# Patient Record
Sex: Female | Born: 1950 | Race: White | Hispanic: No | Marital: Married
Health system: Southern US, Community
[De-identification: ages and names within clinical notes are randomized; demographics above are authoritative.]

## PROBLEM LIST (undated history)

## (undated) DIAGNOSIS — I471 Supraventricular tachycardia, unspecified: Secondary | ICD-10-CM

## (undated) DIAGNOSIS — G4733 Obstructive sleep apnea (adult) (pediatric): Secondary | ICD-10-CM

## (undated) DIAGNOSIS — G4730 Sleep apnea, unspecified: Secondary | ICD-10-CM

## (undated) DIAGNOSIS — I2510 Atherosclerotic heart disease of native coronary artery without angina pectoris: Secondary | ICD-10-CM

## (undated) DIAGNOSIS — E7800 Pure hypercholesterolemia, unspecified: Secondary | ICD-10-CM

## (undated) DIAGNOSIS — K219 Gastro-esophageal reflux disease without esophagitis: Secondary | ICD-10-CM

## (undated) DIAGNOSIS — J4489 Other specified chronic obstructive pulmonary disease: Secondary | ICD-10-CM

## (undated) DIAGNOSIS — I252 Old myocardial infarction: Secondary | ICD-10-CM

## (undated) DIAGNOSIS — J449 Chronic obstructive pulmonary disease, unspecified: Secondary | ICD-10-CM

## (undated) DIAGNOSIS — G43909 Migraine, unspecified, not intractable, without status migrainosus: Secondary | ICD-10-CM

## (undated) DIAGNOSIS — E079 Disorder of thyroid, unspecified: Secondary | ICD-10-CM

## (undated) DIAGNOSIS — M549 Dorsalgia, unspecified: Secondary | ICD-10-CM

## (undated) DIAGNOSIS — E559 Vitamin D deficiency, unspecified: Secondary | ICD-10-CM

## (undated) DIAGNOSIS — F329 Major depressive disorder, single episode, unspecified: Secondary | ICD-10-CM

## (undated) DIAGNOSIS — K5900 Constipation, unspecified: Secondary | ICD-10-CM

## (undated) DIAGNOSIS — Z972 Presence of dental prosthetic device (complete) (partial): Secondary | ICD-10-CM

## (undated) DIAGNOSIS — R002 Palpitations: Secondary | ICD-10-CM

## (undated) DIAGNOSIS — R7303 Prediabetes: Secondary | ICD-10-CM

## (undated) DIAGNOSIS — F32A Depression, unspecified: Secondary | ICD-10-CM

## (undated) DIAGNOSIS — M2550 Pain in unspecified joint: Secondary | ICD-10-CM

## (undated) HISTORY — PX: OTHER SURGICAL HISTORY: SHX169

## (undated) HISTORY — DX: Chronic obstructive pulmonary disease, unspecified: J44.9

## (undated) HISTORY — DX: Sleep apnea, unspecified: G47.30

## (undated) HISTORY — PX: CHOLECYSTECTOMY: SHX55

## (undated) HISTORY — DX: Dorsalgia, unspecified: M54.9

## (undated) HISTORY — DX: Constipation, unspecified: K59.00

## (undated) HISTORY — PX: ABDOMINAL HYSTERECTOMY: SHX81

## (undated) HISTORY — DX: Vitamin D deficiency, unspecified: E55.9

## (undated) HISTORY — DX: Pain in unspecified joint: M25.50

## (undated) HISTORY — DX: Prediabetes: R73.03

## (undated) HISTORY — DX: Palpitations: R00.2

## (undated) HISTORY — DX: Old myocardial infarction: I25.2

---

## 2014-06-14 DIAGNOSIS — I471 Supraventricular tachycardia, unspecified: Secondary | ICD-10-CM | POA: Insufficient documentation

## 2014-07-30 DIAGNOSIS — I4719 Other supraventricular tachycardia: Secondary | ICD-10-CM | POA: Insufficient documentation

## 2014-07-30 HISTORY — PX: OTHER SURGICAL HISTORY: SHX169

## 2018-07-28 DIAGNOSIS — E039 Hypothyroidism, unspecified: Secondary | ICD-10-CM | POA: Insufficient documentation

## 2018-07-28 DIAGNOSIS — K219 Gastro-esophageal reflux disease without esophagitis: Secondary | ICD-10-CM | POA: Insufficient documentation

## 2018-07-28 DIAGNOSIS — F3341 Major depressive disorder, recurrent, in partial remission: Secondary | ICD-10-CM | POA: Insufficient documentation

## 2018-08-04 DIAGNOSIS — E782 Mixed hyperlipidemia: Secondary | ICD-10-CM | POA: Insufficient documentation

## 2018-08-04 DIAGNOSIS — R001 Bradycardia, unspecified: Secondary | ICD-10-CM | POA: Insufficient documentation

## 2018-08-06 ENCOUNTER — Other Ambulatory Visit: Payer: Self-pay | Admitting: Medical Oncology

## 2018-08-06 DIAGNOSIS — Z13820 Encounter for screening for osteoporosis: Secondary | ICD-10-CM

## 2018-08-06 DIAGNOSIS — Z780 Asymptomatic menopausal state: Secondary | ICD-10-CM

## 2018-09-02 DIAGNOSIS — I2510 Atherosclerotic heart disease of native coronary artery without angina pectoris: Secondary | ICD-10-CM | POA: Insufficient documentation

## 2019-01-24 ENCOUNTER — Ambulatory Visit
Admission: EM | Admit: 2019-01-24 | Discharge: 2019-01-24 | Disposition: A | Discharge status: Home / Self Care | Payer: Medicare HMO

## 2019-01-24 ENCOUNTER — Other Ambulatory Visit: Payer: Self-pay

## 2019-01-24 DIAGNOSIS — M778 Other enthesopathies, not elsewhere classified: Secondary | ICD-10-CM

## 2019-01-24 DIAGNOSIS — X503XXA Overexertion from repetitive movements, initial encounter: Secondary | ICD-10-CM | POA: Diagnosis not present

## 2019-01-24 HISTORY — DX: Pure hypercholesterolemia, unspecified: E78.00

## 2019-01-24 HISTORY — DX: Gastro-esophageal reflux disease without esophagitis: K21.9

## 2019-01-24 HISTORY — DX: Supraventricular tachycardia, unspecified: I47.10

## 2019-01-24 HISTORY — DX: Depression, unspecified: F32.A

## 2019-01-24 HISTORY — DX: Major depressive disorder, single episode, unspecified: F32.9

## 2019-01-24 HISTORY — DX: Supraventricular tachycardia: I47.1

## 2019-01-24 HISTORY — DX: Disorder of thyroid, unspecified: E07.9

## 2019-01-24 MED ORDER — MELOXICAM 7.5 MG PO TABS: 7.5000 mg | ORAL_TABLET | Freq: Every day | ORAL | 0 refills | Status: DC

## 2019-01-24 NOTE — ED Triage Notes (Signed)
Pt with left wrist pain x past 9 days. Thinks it is from overuse but unsure. No exact injury.

## 2019-01-24 NOTE — ED Provider Notes (Signed)
[facility]    CSN: [id number] Arrival date & time: 01/24/19  1052     History   Chief Complaint Chief Complaint  Patient presents with  . Wrist Pain    HPI Nicole Robles is a 68 y.o. female.   Nicole Robles presents with complaints of left wrist pain which started over a week ago. Worse with activity, improves at rest. 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 She states it is quite repetitive. She has done this for 21 years. No redness, swelling or warmth. No specific injury. Had to leave work last night due to pain which reached 10/10 in severity. Took advil yesterday and applied icy-hot which minimally helped. Hasn't taken any medications today. 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 Doesn't follow with orthopedics. No numbness tingling. Pain radiates to left thumb. She is right hand dominate.     ROS per HPI, negative if not otherwise mentioned.      Past Medical History:  Diagnosis Date  . Depression   . GERD (gastroesophageal reflux disease)   . High cholesterol   . SVT (supraventricular tachycardia) (HCC)   . Thyroid disease     There are no active problems to display for this patient.   Past Surgical History:  Procedure Laterality Date  . ABDOMINAL HYSTERECTOMY    . CHOLECYSTECTOMY    . heart ablation      OB History   No obstetric history on file.      Home Medications    Prior to Admission medications   Medication Sig Start Date End Date Taking? Authorizing Provider  aspirin EC 81 MG tablet Take 81 mg by mouth daily.   Yes [provider]  levothyroxine (SYNTHROID) 88 MCG tablet Take 88 mcg by mouth daily before breakfast.   Yes [provider]  metoprolol tartrate (LOPRESSOR) 50 MG tablet Take 50 mg by mouth 2 (two) times daily.   Yes [provider]  omeprazole (PRILOSEC)  20 MG capsule Take 20 mg by mouth daily.   Yes [provider]  pravastatin (PRAVACHOL) 10 MG tablet Take 10 mg by mouth daily.   Yes [provider]  sertraline (ZOLOFT) 25 MG tablet Take 25 mg by mouth daily.   Yes [provider]  meloxicam (MOBIC) 7.5 MG tablet Take 1 tablet (7.5 mg total) by mouth daily. 01/24/19   Georgetta Haber, NP    Family History History reviewed. No pertinent family history.  Social History Social History   Tobacco Use  . Smoking status: Never Smoker  . Smokeless tobacco: Never Used  Substance Use Topics  . Alcohol use: Not Currently  . Drug use: Not Currently     Allergies   Augmentin [amoxicillin-pot clavulanate] and Darvon [propoxyphene]   Review of Systems Review of Systems   Physical Exam Triage Vital Signs ED Triage Vitals  Enc Vitals Group     BP 01/24/19 1111 (!) 144/90     Pulse Rate 01/24/19 1111 (!) 56     Resp 01/24/19 1111 17     Temp 01/24/19 1111 98.1 F (36.7 C)     Temp Source 01/24/19 1111 Oral     SpO2 01/24/19 1111 98 %     Weight 01/24/19 1105 185 lb (83.9 kg)     Height 01/24/19 1105 4\' 11"  (1.499  m)     Head Circumference --      Peak Flow --      Pain Score 01/24/19 1105 10     Pain Loc --      Pain Edu? --      Excl. in GC? --    No data found.  Updated Vital Signs BP (!) 144/90 (BP Location: Left Arm)   Pulse (!) 56   Temp 98.1 F (36.7 C) (Oral)   Resp 17   Ht 4\' 11"  (1.499 m)   Wt 185 lb (83.9 kg)   SpO2 98%   BMI 37.37 kg/m   Visual Acuity Right Eye Distance:   Left Eye Distance:   Bilateral Distance:    Right Eye Near:   Left Eye Near:    Bilateral Near:     Physical Exam Constitutional:      General: She is not in acute distress.    Appearance: She is well-developed.  Cardiovascular:     Rate and Rhythm: Normal rate and regular rhythm.     Heart sounds: Normal heart sounds.  Pulmonary:     Effort: Pulmonary effort is normal.     Breath sounds: Normal  breath sounds.  Musculoskeletal:     Left wrist: She exhibits tenderness. She exhibits normal range of motion, no bony tenderness, no swelling, no effusion, no crepitus, no deformity and no laceration.     Comments: Strong radial pulse; no redness or swelling; cap refill < 2 seconds to fingers; gross sensation intact; positive finkelstein; tenderness to radial aspect of left wrist without specific bony point tenderness   Skin:    General: Skin is warm and dry.  Neurological:     Mental Status: She is alert and oriented to person, place, and time.      UC Treatments / Results  Labs (all labs ordered are listed, but only abnormal results are displayed) Labs Reviewed - No data to display  EKG None  Radiology No results found.  Procedures Procedures (including critical care time)  Medications Ordered in UC Medications - No data to display  Initial Impression / Assessment and Plan / UC Course  I have reviewed the triage vital signs and the nursing notes.  Pertinent labs & imaging results that were available during my care of the patient were reviewed by me and considered in my medical decision making (see chart for details).     H&p consistent with de quervain tenosynovitis. Thumb spica provided and encouraged nsaid, ice, elevation. Follow up with pcp and/or ortho as needed for persistent symptoms. Patient verbalized understanding and agreeable to plan.   Final Clinical Impressions(s) / UC Diagnoses   Final diagnoses:  Left wrist tendonitis     Discharge Instructions     Ice and elevation of wrist, especially after working.  Daily meloxicam, take with food.  Use of brace as provided, especially while working.  Follow up with your primary care provider and/or orthopedics for any persistent or worsening of symptoms as may need further intervention.    ED Prescriptions    Medication Sig Dispense Auth. Provider   meloxicam (MOBIC) 7.5 MG tablet Take 1 tablet (7.5 mg  total) by mouth daily. 20 tablet Georgetta HaberBurky,  B, NP     Controlled Substance Prescriptions [place] Controlled Substance Registry consulted? Not Applicable   Georgetta HaberBurky,  B, NP 01/24/19 1136

## 2019-01-24 NOTE — Discharge Instructions (Signed)
Ice and elevation of wrist, especially after working.  Daily meloxicam, take with food.  Use of brace as provided, especially while working.  Follow up with your primary care provider and/or orthopedics for any persistent or worsening of symptoms as may need further intervention.

## 2019-01-26 DIAGNOSIS — G43009 Migraine without aura, not intractable, without status migrainosus: Secondary | ICD-10-CM | POA: Insufficient documentation

## 2020-03-03 ENCOUNTER — Encounter: Payer: Self-pay | Admitting: Emergency Medicine

## 2020-03-03 ENCOUNTER — Emergency Department
Admission: EM | Admit: 2020-03-03 | Discharge: 2020-03-03 | Disposition: A | Discharge status: Home / Self Care | Payer: Medicare HMO | Attending: Emergency Medicine | Admitting: Emergency Medicine

## 2020-03-03 ENCOUNTER — Other Ambulatory Visit: Payer: Self-pay

## 2020-03-03 ENCOUNTER — Emergency Department: Payer: Medicare HMO

## 2020-03-03 DIAGNOSIS — J40 Bronchitis, not specified as acute or chronic: Secondary | ICD-10-CM | POA: Insufficient documentation

## 2020-03-03 DIAGNOSIS — Z7982 Long term (current) use of aspirin: Secondary | ICD-10-CM | POA: Insufficient documentation

## 2020-03-03 DIAGNOSIS — R0602 Shortness of breath: Secondary | ICD-10-CM | POA: Insufficient documentation

## 2020-03-03 DIAGNOSIS — Z79899 Other long term (current) drug therapy: Secondary | ICD-10-CM | POA: Insufficient documentation

## 2020-03-03 DIAGNOSIS — Z20822 Contact with and (suspected) exposure to covid-19: Secondary | ICD-10-CM | POA: Insufficient documentation

## 2020-03-03 DIAGNOSIS — R5383 Other fatigue: Secondary | ICD-10-CM | POA: Diagnosis present

## 2020-03-03 LAB — COMPREHENSIVE METABOLIC PANEL
ALT: 18 U/L (ref 0–44)
AST: 23 U/L (ref 15–41)
Albumin: 3.6 g/dL (ref 3.5–5.0)
Alkaline Phosphatase: 79 U/L (ref 38–126)
Anion gap: 7 (ref 5–15)
BUN: 9 mg/dL (ref 8–23)
CO2: 29 mmol/L (ref 22–32)
Calcium: 8.7 mg/dL — ABNORMAL LOW (ref 8.9–10.3)
Chloride: 103 mmol/L (ref 98–111)
Creatinine, Ser: 0.78 mg/dL (ref 0.44–1.00)
GFR calc Af Amer: >60 mL/min (ref >60)
GFR calc non Af Amer: >60 mL/min (ref >60)
Glucose, Bld: 107 mg/dL — ABNORMAL HIGH (ref 70–99)
Potassium: 4.1 mmol/L (ref 3.5–5.1)
Sodium: 139 mmol/L (ref 135–145)
Total Bilirubin: 0.6 mg/dL (ref 0.3–1.2)
Total Protein: 7.2 g/dL (ref 6.5–8.1)

## 2020-03-03 LAB — CBC WITH DIFFERENTIAL/PLATELET
Abs Immature Granulocytes: 0.02 10*3/uL (ref 0.00–0.07)
Basophils Absolute: 0.1 10*3/uL (ref 0.0–0.1)
Basophils Relative: 1 %
Eosinophils Absolute: 0.5 10*3/uL (ref 0.0–0.5)
Eosinophils Relative: 6 %
HCT: 39.3 % (ref 36.0–46.0)
Hemoglobin: 12.6 g/dL (ref 12.0–15.0)
Immature Granulocytes: 0 %
Lymphocytes Relative: 36 %
Lymphs Abs: 3 10*3/uL (ref 0.7–4.0)
MCH: 28.3 pg (ref 26.0–34.0)
MCHC: 32.1 g/dL (ref 30.0–36.0)
MCV: 88.3 fL (ref 80.0–100.0)
Monocytes Absolute: 0.5 10*3/uL (ref 0.1–1.0)
Monocytes Relative: 6 %
Neutro Abs: 4.2 10*3/uL (ref 1.7–7.7)
Neutrophils Relative %: 51 %
Platelets: 226 10*3/uL (ref 150–400)
RBC: 4.45 MIL/uL (ref 3.87–5.11)
RDW: 13.2 % (ref 11.5–15.5)
WBC: 8.3 10*3/uL (ref 4.0–10.5)
nRBC: 0 % (ref 0.0–0.2)

## 2020-03-03 LAB — BRAIN NATRIURETIC PEPTIDE: B Natriuretic Peptide: 73.5 pg/mL (ref 0.0–100.0)

## 2020-03-03 LAB — TROPONIN I (HIGH SENSITIVITY): Troponin I (High Sensitivity): 3 ng/L (ref <18)

## 2020-03-03 LAB — SARS CORONAVIRUS 2 BY RT PCR (HOSPITAL ORDER, PERFORMED IN ~~LOC~~ HOSPITAL LAB): SARS Coronavirus 2: NEGATIVE

## 2020-03-03 LAB — MAGNESIUM: Magnesium: 2.1 mg/dL (ref 1.7–2.4)

## 2020-03-03 LAB — TSH: TSH: 1.37 u[IU]/mL (ref 0.350–4.500)

## 2020-03-03 LAB — T4, FREE: Free T4: 0.78 ng/dL (ref 0.61–1.12)

## 2020-03-03 MED ORDER — BENZONATATE 100 MG PO CAPS: 100.0000 mg | ORAL_CAPSULE | Freq: Three times a day (TID) | ORAL | 0 refills | Status: DC | PRN

## 2020-03-03 MED ORDER — DEXAMETHASONE SODIUM PHOSPHATE 10 MG/ML IJ SOLN
10.0000 mg | Freq: Once | INTRAMUSCULAR | Status: AC
  Administered 2020-03-03: 10 mg via INTRAVENOUS
  Filled 2020-03-03: qty 1

## 2020-03-03 MED ORDER — ALBUTEROL SULFATE HFA 108 (90 BASE) MCG/ACT IN AERS: INHALATION_SPRAY | RESPIRATORY_TRACT | 1 refills | Status: DC

## 2020-03-03 MED ORDER — HYDROCODONE-HOMATROPINE 5-1.5 MG/5ML PO SYRP: 5.0000 mL | ORAL_SOLUTION | Freq: Four times a day (QID) | ORAL | 0 refills | Status: DC | PRN

## 2020-03-03 NOTE — Discharge Instructions (Addendum)
As we discussed, your work-up tonight was reassuring.  Your chest x-ray did not show a specific abnormality and given that you have no history of congestive heart failure it is unlikely that you have developed too much fluid in your lungs.  I believe you are suffering from bronchitis and the medication you were given (Decadron 10 mg IV) as well as albuterol inhaler and cough syrup you have prescribed should help.  Please follow-up with your regular doctor at the next available opportunity.  If your symptoms get worse or if you develop new symptoms that concern you, please return to the emergency department.

## 2020-03-03 NOTE — ED Triage Notes (Signed)
Pt to ED via EMS from home c/o weakness x1 week.  Pt A&Ox4, chest rise even and unlabored, states was walking to bathroom tonight and felt too weak to continue.

## 2020-03-03 NOTE — ED Provider Notes (Addendum)
[facility] Emergency Department Provider Note  ____________________________________________   First MD Initiated Contact with Patient 03/03/20 [phone]     (approximate)  I have reviewed the triage vital signs and the nursing notes.   HISTORY  Chief Complaint Fatigue    HPI Nicole Robles is a 69 y.o. female with medical history as listed below who presents by EMS for evaluation of fatigue and generalized weakness.  She reports that she has had a cough for about a week and felt very rundown.  She has also had subjective fevers, nasal congestion/runny nose, and some sinus pressure.  She has had some shortness of breath mostly associated with exertion.  She reports that her symptoms are severe.  She went to [facility] and had a negative rapid antigen Covid test and then she went to the urgent care  2 to 3 days ago and had a PCR Covid test that came back negative.  It was explained to her that she likely had a viral infection and that antibiotics did not serve a role at this time.  However she says that she is continues to not feel well.  She got up during the night and felt lightheaded so she called 911.  She reports that she has been eating and drinking normally.  She denies chest pain, nausea, vomiting, abdominal pain, and dysuria.  Nothing in particular makes her symptoms better and exertion seems to make him a little bit worse.  She said that her voice is hoarse because of all the coughing but she does not have a sore throat now.  She did previously have a sore throat.        Past Medical History:  Diagnosis Date  . Depression   . GERD (gastroesophageal reflux disease)   . High cholesterol   . SVT (supraventricular tachycardia) (HCC)   . Thyroid disease     There are no problems to display for this patient.   Past Surgical History:  Procedure Laterality Date  . ABDOMINAL HYSTERECTOMY    . CHOLECYSTECTOMY    . heart ablation      Prior to  Admission medications   Medication Sig Start Date End Date Taking? Authorizing Provider  albuterol (VENTOLIN HFA) 108 (90 Base) MCG/ACT inhaler Inhale 2-4 puffs by mouth every 4 hours as needed for wheezing, cough, and/or shortness of breath 03/03/20   Loleta Rose, MD  aspirin EC 81 MG tablet Take 81 mg by mouth daily.    [provider]  benzonatate (TESSALON PERLES) 100 MG capsule Take 1 capsule (100 mg total) by mouth 3 (three) times daily as needed for cough. 03/03/20   Loleta Rose, MD  HYDROcodone-homatropine [facility]) 5-1.5 MG/5ML syrup Take 5 mLs by mouth every 6 (six) hours as needed for cough. 03/03/20   Loleta Rose, MD  levothyroxine (SYNTHROID) 88 MCG tablet Take 88 mcg by mouth daily before breakfast.    [provider]  meloxicam (MOBIC) 7.5 MG tablet Take 1 tablet (7.5 mg total) by mouth daily. 01/24/19   Georgetta Haber, NP  metoprolol tartrate (LOPRESSOR) 50 MG tablet Take 50 mg by mouth 2 (two) times daily.    [provider]  omeprazole (PRILOSEC) 20 MG capsule Take 20 mg by mouth daily.    [provider]  pravastatin (PRAVACHOL) 10 MG tablet Take 10 mg by mouth daily.    [provider]  sertraline (ZOLOFT) 25 MG tablet Take 25 mg by mouth daily.    [provider]    Allergies Augmentin [amoxicillin-pot clavulanate] and Darvon [propoxyphene]  History reviewed. No pertinent family history.  Social History Social History   Tobacco Use  . Smoking status: Never Smoker  . Smokeless tobacco: Never Used  Substance Use Topics  . Alcohol use: Not Currently  . Drug use: Not Currently    Review of Systems Constitutional: Subjective fever Eyes: No visual changes. ENT: Hoarse voice.  Sore throat previously, but no longer.  Nasal congestion and sinus pressure. Cardiovascular: Denies chest pain. Respiratory: Persistent cough.  Mild SOB with exertion. Gastrointestinal: No abdominal pain.  No nausea, no vomiting.  No  diarrhea.  No constipation.  Continued appropriate oral intake. Genitourinary: Negative for dysuria. Musculoskeletal: Negative for neck pain.  Negative for back pain. Integumentary: Negative for rash. Neurological: Negative for headaches, focal weakness or numbness.   ____________________________________________   PHYSICAL EXAM:  VITAL SIGNS: ED Triage Vitals  Enc Vitals Group     BP 03/03/20 0401 (!) 145/95     Pulse Rate 03/03/20 0401 60     Resp 03/03/20 0401 18     Temp 03/03/20 0401 97.9 F (36.6 C)     Temp Source 03/03/20 0401 Oral     SpO2 03/03/20 0401 97 %     Weight 03/03/20 0402 90.7 kg (200 lb)     Height 03/03/20 0402 1.511 m (4' 11.5")     Head Circumference --      Peak Flow --      Pain Score 03/03/20 0401 0     Pain Loc --      Pain Edu? --      Excl. in GC? --     Constitutional: Alert and oriented.  No acute distress. Eyes: Conjunctivae are normal.  Head: Atraumatic. Nose: No congestion/rhinnorhea. Mouth/Throat: Patient is wearing a mask. Neck: No stridor.  No meningeal signs.   Cardiovascular: Normal rate, regular rhythm. Good peripheral circulation. Grossly normal heart sounds. Respiratory: Normal respiratory effort.  No retractions.  Breath sounds are clear throughout with no wheezes, rales, nor rhonchi.  However she has episodes of bronchospasms with prolonged coughing episodes while I am in the room. Gastrointestinal: Soft and nontender. No distention.  Musculoskeletal: No lower extremity tenderness nor edema. No gross deformities of extremities. Neurologic:  Normal speech and language. No gross focal neurologic deficits are appreciated.  Skin:  Skin is warm, dry and intact. Psychiatric: Mood and affect are normal. Speech and behavior are normal.  ____________________________________________   LABS (all labs ordered are listed, but only abnormal results are displayed)  Labs Reviewed  COMPREHENSIVE METABOLIC PANEL - Abnormal; Notable for the  following components:      Result Value   Glucose, Bld 107 (*)    Calcium 8.7 (*)    All other components within normal limits  SARS CORONAVIRUS 2 BY RT PCR (HOSPITAL ORDER, PERFORMED IN [facility])  BRAIN NATRIURETIC PEPTIDE  MAGNESIUM  CBC WITH DIFFERENTIAL/PLATELET  TSH  T4, FREE  TROPONIN I (HIGH SENSITIVITY)   ____________________________________________  EKG  None - EKG not ordered by ED physician ____________________________________________  RADIOLOGY Marylou Mccoy, personally viewed and evaluated these images (plain radiographs) as part of my medical decision making, as well as reviewing the written report by the radiologist.  ED MD interpretation: Bronchitis versus pulmonary vascular congestion, no pulmonary edema  Official radiology report(s): DG Chest Portable 1 View  Result Date: 03/03/2020 CLINICAL DATA:  Shortness of breath.  Cough for 1 week. EXAM: PORTABLE  CHEST 1 VIEW COMPARISON:  None. FINDINGS: The heart is mildly enlarged, exaggerated by low lung volumes. Atherosclerotic changes are noted at the aortic arch. Mild bibasilar airspace opacities are present. No significant consolidation is present. Mild pulmonary vascular congestion is noted without frank edema. No effusions are present. IMPRESSION: 1. Mild cardiomegaly and pulmonary vascular congestion without frank edema. 2. Mild bibasilar airspace disease likely reflects atelectasis. Infection is considered less likely. 3. Aortic atherosclerosis. Electronically Signed   By: San Morelle M.D.   On: 03/03/2020 04:35    ____________________________________________   PROCEDURES   Procedure(s) performed (including Critical Care):  Procedures   ____________________________________________   INITIAL IMPRESSION / MDM / [place] / ED COURSE  As part of my medical decision making, I reviewed the following data within the [place] notes reviewed and  incorporated, Labs reviewed , Old chart reviewed, Radiograph reviewed  and Notes from prior ED visits and reviewed [place] controlled substance database.   Differential diagnosis includes, but is not limited to, COVID-19, nonspecific viral illness, COPD exacerbation (the patient says she is told that she has COPD but she has never been formally diagnosed), asthma, pneumonia.  The patient's vital signs are stable.  She is afebrile.  COVID-19 test is pending although she says she has already had a PCR test.   Clinical Course as of Mar 04 611  Fri Mar 03, 2020  0520 SARS Coronavirus 2: NEGATIVE [CF]  0605 Work-up has been reassuring.  The patient has been stable for more than 2 hours in the emergency department.  I added on TSH and free T4 and the patient knows she can check these in MyChart, but there is no indication to keep her longer because I will not be making any dose changes to her levothyroxine.  She has a primary care provider and she knows to follow-up with the doctor.  Her CBC, CMP, and troponin are all within normal limits.  Vital signs have been stable and she is normotensive.  Chest x-ray shows pulmonary vascular congestion but no frank pulmonary edema.  She has no history of CHF and based on her history with upper respiratory symptoms, frequent cough, nasal congestion, etc., I think that the chest x-ray is more representative of a bronchitic pattern than fluid overload.  She has no other signs or symptoms of fluid overload and no history of heart failure.  I encourage close follow-up with her primary care doctor.  I will treat her empirically with Decadron 10 mg IV, and write prescriptions for an albuterol inhaler and some cough medicine.   [CF]  0607 I discussed this with the patient and her husband and they understand and agree with the plan.   [CF]    Clinical Course User Index [CF] Hinda Kehr, MD     ____________________________________________  FINAL CLINICAL  IMPRESSION(S) / ED DIAGNOSES  Final diagnoses:  Bronchitis     MEDICATIONS GIVEN DURING THIS VISIT:  Medications  dexamethasone (DECADRON) injection 10 mg (has no administration in time range)     ED Discharge Orders         Ordered    HYDROcodone-homatropine (HYCODAN) 5-1.5 MG/5ML syrup  Every 6 hours PRN     03/03/20 0612    benzonatate (TESSALON PERLES) 100 MG capsule  3 times daily PRN     03/03/20 0612    albuterol (VENTOLIN HFA) 108 (90 Base) MCG/ACT inhaler     03/03/20 0612          *  Please note:  Nicole Robles was evaluated in Emergency Department on 03/03/2020 for the symptoms described in the history of present illness. She was evaluated in the context of the global COVID-19 pandemic, which necessitated consideration that the patient might be at risk for infection with the SARS-CoV-2 virus that causes COVID-19. Institutional protocols and algorithms that pertain to the evaluation of patients at risk for COVID-19 are in a state of rapid change based on information released by regulatory bodies including the CDC and federal and state organizations. These policies and algorithms were followed during the patient's care in the ED.  Some ED evaluations and interventions may be delayed as a result of limited staffing during the pandemic.*  Note:  This document was prepared using Dragon voice recognition software and may include unintentional dictation errors.   Loleta Rose, MD 03/03/20 5894    Loleta Rose, MD 03/03/20 [phone]

## 2020-04-18 ENCOUNTER — Telehealth: Payer: Self-pay

## 2020-04-18 NOTE — Telephone Encounter (Signed)
Patient called in asking for someone to call her back. Attempted x 1. No answer. No recent visit with [place], unsure for reason of call. [facility]

## 2020-10-11 ENCOUNTER — Other Ambulatory Visit: Payer: Self-pay

## 2020-10-11 DIAGNOSIS — Z1231 Encounter for screening mammogram for malignant neoplasm of breast: Secondary | ICD-10-CM

## 2020-10-31 ENCOUNTER — Ambulatory Visit: Payer: Medicare HMO

## 2020-12-05 ENCOUNTER — Ambulatory Visit
Admission: RE | Admit: 2020-12-05 | Discharge: 2020-12-05 | Disposition: A | Discharge status: Home / Self Care | Payer: Medicare (Managed Care) | Source: Ambulatory Visit

## 2020-12-05 ENCOUNTER — Other Ambulatory Visit: Payer: Self-pay

## 2020-12-05 DIAGNOSIS — Z1231 Encounter for screening mammogram for malignant neoplasm of breast: Secondary | ICD-10-CM | POA: Insufficient documentation

## 2020-12-06 ENCOUNTER — Inpatient Hospital Stay
Admission: RE | Admit: 2020-12-06 | Discharge: 2020-12-06 | Disposition: A | Discharge status: Home / Self Care | Payer: Self-pay | Source: Ambulatory Visit | Attending: *Deleted | Admitting: *Deleted

## 2020-12-06 ENCOUNTER — Other Ambulatory Visit: Payer: Self-pay | Admitting: *Deleted

## 2020-12-06 DIAGNOSIS — Z1231 Encounter for screening mammogram for malignant neoplasm of breast: Secondary | ICD-10-CM

## 2020-12-19 DIAGNOSIS — J4489 Other specified chronic obstructive pulmonary disease: Secondary | ICD-10-CM | POA: Insufficient documentation

## 2021-09-06 ENCOUNTER — Other Ambulatory Visit: Payer: Self-pay | Admitting: Family Medicine

## 2021-09-06 DIAGNOSIS — Z1231 Encounter for screening mammogram for malignant neoplasm of breast: Secondary | ICD-10-CM

## 2021-11-28 ENCOUNTER — Other Ambulatory Visit: Payer: Self-pay | Admitting: Family Medicine

## 2021-11-28 DIAGNOSIS — R1901 Right upper quadrant abdominal swelling, mass and lump: Secondary | ICD-10-CM

## 2021-11-28 DIAGNOSIS — R1011 Right upper quadrant pain: Secondary | ICD-10-CM

## 2021-12-10 ENCOUNTER — Other Ambulatory Visit: Payer: Self-pay

## 2021-12-10 ENCOUNTER — Ambulatory Visit
Admission: RE | Admit: 2021-12-10 | Discharge: 2021-12-10 | Disposition: A | Discharge status: Home / Self Care | Payer: Medicare PPO | Source: Ambulatory Visit | Attending: Family Medicine | Admitting: Family Medicine

## 2021-12-10 DIAGNOSIS — Z1231 Encounter for screening mammogram for malignant neoplasm of breast: Secondary | ICD-10-CM

## 2021-12-13 ENCOUNTER — Other Ambulatory Visit: Payer: Self-pay

## 2021-12-13 ENCOUNTER — Ambulatory Visit
Admission: RE | Admit: 2021-12-13 | Discharge: 2021-12-13 | Disposition: A | Discharge status: Home / Self Care | Payer: Medicare PPO | Source: Ambulatory Visit | Attending: Family Medicine | Admitting: Family Medicine

## 2021-12-13 DIAGNOSIS — R1011 Right upper quadrant pain: Secondary | ICD-10-CM | POA: Insufficient documentation

## 2021-12-13 DIAGNOSIS — R1901 Right upper quadrant abdominal swelling, mass and lump: Secondary | ICD-10-CM | POA: Insufficient documentation

## 2022-06-17 DIAGNOSIS — R7303 Prediabetes: Secondary | ICD-10-CM | POA: Insufficient documentation

## 2022-11-29 ENCOUNTER — Encounter: Payer: Self-pay | Admitting: Family Medicine

## 2022-11-29 ENCOUNTER — Ambulatory Visit (INDEPENDENT_AMBULATORY_CARE_PROVIDER_SITE_OTHER): Payer: No Typology Code available for payment source | Admitting: Family Medicine

## 2022-11-29 VITALS — BP 116/70 | HR 65 | Ht 59.5 in | Wt 218.0 lb

## 2022-11-29 DIAGNOSIS — Z8669 Personal history of other diseases of the nervous system and sense organs: Secondary | ICD-10-CM

## 2022-11-29 MED ORDER — SUMATRIPTAN SUCCINATE 100 MG PO TABS: 100.0000 mg | ORAL_TABLET | Freq: Once | ORAL | 0 refills | Status: DC

## 2022-11-29 NOTE — Progress Notes (Addendum)
Date:  11/29/2022   Name:  Nicole Robles   DOB:  Mar 06, 1951   MRN:  [id number]   Chief Complaint: migraines and Establish Care  Patient is a 72 year old female who presents for a establish care exam. The patient reports the following problems: migraine. Health maintenance has been reviewed up to date.    Migraine  This is a chronic problem. The current episode started more than 1 year ago. The problem occurs intermittently. The pain is located in the Bilateral region. The quality of the pain is described as throbbing. The pain is moderate. The symptoms are aggravated by bright light and noise. She has tried darkened room and triptans (imetrex) for the symptoms. Her past medical history is significant for migraine headaches.    Lab Results  Component Value Date   NA 139 03/03/2020   K 4.1 03/03/2020   CO2 29 03/03/2020   GLUCOSE 107 (H) 03/03/2020   BUN 9 03/03/2020   CREATININE 0.78 03/03/2020   CALCIUM 8.7 (L) 03/03/2020   GFRNONAA >60 03/03/2020   No results found for: "CHOL", "HDL", "LDLCALC", "LDLDIRECT", "TRIG", "CHOLHDL" Lab Results  Component Value Date   TSH 1.370 03/03/2020   No results found for: "HGBA1C" Lab Results  Component Value Date   WBC 8.3 03/03/2020   HGB 12.6 03/03/2020   HCT 39.3 03/03/2020   MCV 88.3 03/03/2020   PLT 226 03/03/2020   Lab Results  Component Value Date   ALT 18 03/03/2020   AST 23 03/03/2020   ALKPHOS 79 03/03/2020   BILITOT 0.6 03/03/2020   No results found for: "25OHVITD2", "25OHVITD3", "VD25OH"   Review of Systems  Constitutional:  Negative for diaphoresis, fatigue and unexpected weight change.  HENT:  Negative for trouble swallowing.   Eyes:  Negative for visual disturbance.  Respiratory:  Negative for chest tightness and shortness of breath.   Cardiovascular:  Positive for palpitations. Negative for chest pain and leg swelling.  Endocrine: Positive for polyuria. Negative for polydipsia.  Genitourinary:  Negative  for difficulty urinating and frequency.       Sees urologist    There are no problems to display for this patient.   Allergies  Allergen Reactions   Augmentin [Amoxicillin-Pot Clavulanate] Hives   Darvon [Propoxyphene] Nausea And Vomiting   Demerol [Meperidine Hcl]     Past Surgical History:  Procedure Laterality Date   ABDOMINAL HYSTERECTOMY     CHOLECYSTECTOMY     heart ablation      Social History   Tobacco Use   Smoking status: Former    Packs/day: 1.50    Years: 20.00    Total pack years: 30.00    Types: Cigarettes    Quit date: 10/07/1996    Years since quitting: 26.1   Smokeless tobacco: Never  Vaping Use   Vaping Use: Never used  Substance Use Topics   Alcohol use: Never   Drug use: Never     Medication list has been reviewed and updated.  Current Meds  Medication Sig   albuterol (VENTOLIN HFA) 108 (90 Base) MCG/ACT inhaler Inhale 2-4 puffs by mouth every 4 hours as needed for wheezing, cough, and/or shortness of breath   aspirin EC 81 MG tablet Take 81 mg by mouth daily.   levothyroxine (SYNTHROID) 88 MCG tablet Take 88 mcg by mouth daily before breakfast.   metoprolol tartrate (LOPRESSOR) 50 MG tablet Take 50 mg by mouth daily. cardio   omeprazole (PRILOSEC) 20 MG capsule Take 20  mg by mouth 2 (two) times daily before a meal. GI   pravastatin (PRAVACHOL) 10 MG tablet Take 10 mg by mouth daily.   sertraline (ZOLOFT) 25 MG tablet Take 25 mg by mouth daily.   SUMAtriptan (IMITREX) 100 MG tablet Take 100 mg by mouth.   traZODone (DESYREL) 50 MG tablet Take 50 mg by mouth at bedtime.       11/29/2022    1:26 PM  GAD 7 : Generalized Anxiety Score  Nervous, Anxious, on Edge 0  Control/stop worrying 0  Worry too much - different things 0  Trouble relaxing 0  Restless 0  Easily annoyed or irritable 0  Afraid - awful might happen 0  Total GAD 7 Score 0  Anxiety Difficulty Not difficult at all       11/29/2022    1:25 PM  Depression screen PHQ 2/9   Decreased Interest 0  Down, Depressed, Hopeless 0  PHQ - 2 Score 0  Altered sleeping 0  Tired, decreased energy 1  Change in appetite 0  Feeling bad or failure about yourself  0  Trouble concentrating 0  Moving slowly or fidgety/restless 0  Suicidal thoughts 0  PHQ-9 Score 1  Difficult doing work/chores Not difficult at all    BP Readings from Last 3 Encounters:  11/29/22 116/70  03/03/20 138/80  01/24/19 (!) 144/90    Physical Exam Vitals and nursing note reviewed. Exam conducted with a chaperone present.  Constitutional:      General: She is not in acute distress.    Appearance: She is not diaphoretic.  HENT:     Head: Normocephalic and atraumatic.     Right Ear: Tympanic membrane and external ear normal.     Left Ear: Tympanic membrane and external ear normal.     Nose: Nose normal.     Mouth/Throat:     Mouth: Mucous membranes are moist.  Eyes:     General:        Right eye: No discharge.        Left eye: No discharge.     Conjunctiva/sclera: Conjunctivae normal.     Pupils: Pupils are equal, round, and reactive to light.  Neck:     Thyroid: No thyroid mass, thyromegaly or thyroid tenderness.     Vascular: No JVD.  Cardiovascular:     Rate and Rhythm: Normal rate and regular rhythm.     Heart sounds: Normal heart sounds. No murmur heard.    No friction rub. No gallop.  Pulmonary:     Effort: Pulmonary effort is normal.     Breath sounds: Normal breath sounds. No rhonchi or rales.  Abdominal:     General: Bowel sounds are normal.     Palpations: Abdomen is soft. There is no mass.     Tenderness: There is no abdominal tenderness. There is no guarding.  Musculoskeletal:        General: Normal range of motion.     Cervical back: Normal range of motion and neck supple.  Lymphadenopathy:     Cervical: No cervical adenopathy.  Skin:    General: Skin is warm and dry.  Neurological:     Mental Status: She is alert.     Deep Tendon Reflexes: Reflexes are  normal and symmetric.     Wt Readings from Last 3 Encounters:  11/29/22 218 lb (98.9 kg)  03/03/20 200 lb (90.7 kg)  01/24/19 185 lb (83.9 kg)    BP 116/70   Pulse  65   Ht 4' 11.5" (1.511 m)   Wt 218 lb (98.9 kg)   SpO2 95%   BMI 43.29 kg/m   Assessment and Plan:  1. History of migraine headaches Chronic.  Episodic.  Controlled with Imitrex.  Patient has a somewhat atypical presentation of what she describes as a migraine but it does have photophobia associated with it.  We will refer to neurology to maximize control in the meantime we will refill her sumatriptan 100 mg take once and may repeat in 2 hours if pain is unrelieved with first dose. - Ambulatory referral to Neurology - SUMAtriptan (IMITREX) 100 MG tablet; Take 1 tablet (100 mg total) by mouth once for 1 dose. Repeat in 2 hrs x 1 if cont headache  Dispense: 9 tablet; Refill: 0    Otilio Miu, MD

## 2022-12-05 ENCOUNTER — Encounter: Payer: Self-pay | Admitting: Neurology

## 2022-12-23 ENCOUNTER — Encounter: Payer: Self-pay | Admitting: Family Medicine

## 2022-12-23 ENCOUNTER — Ambulatory Visit (INDEPENDENT_AMBULATORY_CARE_PROVIDER_SITE_OTHER): Payer: No Typology Code available for payment source | Admitting: Family Medicine

## 2022-12-23 VITALS — BP 90/62 | HR 63 | Ht 59.5 in | Wt 218.0 lb

## 2022-12-23 DIAGNOSIS — F5101 Primary insomnia: Secondary | ICD-10-CM

## 2022-12-23 DIAGNOSIS — E782 Mixed hyperlipidemia: Secondary | ICD-10-CM | POA: Diagnosis not present

## 2022-12-23 DIAGNOSIS — J4489 Other specified chronic obstructive pulmonary disease: Secondary | ICD-10-CM | POA: Diagnosis not present

## 2022-12-23 DIAGNOSIS — E034 Atrophy of thyroid (acquired): Secondary | ICD-10-CM | POA: Diagnosis not present

## 2022-12-23 DIAGNOSIS — R7303 Prediabetes: Secondary | ICD-10-CM | POA: Diagnosis not present

## 2022-12-23 DIAGNOSIS — F3341 Major depressive disorder, recurrent, in partial remission: Secondary | ICD-10-CM

## 2022-12-23 MED ORDER — PRAVASTATIN SODIUM 10 MG PO TABS: 10.0000 mg | ORAL_TABLET | Freq: Every day | ORAL | 1 refills | Status: DC

## 2022-12-23 MED ORDER — TRAZODONE HCL 50 MG PO TABS: 50.0000 mg | ORAL_TABLET | Freq: Every day | ORAL | 1 refills | Status: DC

## 2022-12-23 MED ORDER — LEVOTHYROXINE SODIUM 88 MCG PO TABS: 88.0000 ug | ORAL_TABLET | Freq: Every day | ORAL | 1 refills | Status: DC

## 2022-12-23 MED ORDER — ALBUTEROL SULFATE HFA 108 (90 BASE) MCG/ACT IN AERS: INHALATION_SPRAY | RESPIRATORY_TRACT | 1 refills | Status: DC

## 2022-12-23 MED ORDER — SERTRALINE HCL 25 MG PO TABS: 25.0000 mg | ORAL_TABLET | Freq: Every day | ORAL | 1 refills | Status: DC

## 2022-12-23 NOTE — Progress Notes (Signed)
Date:  12/23/2022   Name:  Nicole Robles   DOB:  07/10/1951   MRN:  [id number]   Chief Complaint: preddiabetes, Insomnia, Depression, Hyperlipidemia, Hypothyroidism, and Asthma  Insomnia Primary symptoms: difficulty falling asleep.   The current episode started more than one year. The problem has been gradually improving since onset. Past treatments include medication. The treatment provided moderate relief. PMH includes: depression.   Depression        This is a chronic problem.  The onset quality is gradual.   The problem has been gradually improving since onset.  Associated symptoms include no decreased concentration, no fatigue, no helplessness, no hopelessness, does not have insomnia, not irritable, no restlessness, no decreased interest, no appetite change, no body aches, no myalgias, no headaches, no indigestion, not sad and no suicidal ideas.  Past treatments include SSRIs - Selective serotonin reuptake inhibitors.  Past medical history includes thyroid problem.   Hyperlipidemia This is a chronic problem. The current episode started more than 1 year ago. The problem is controlled. Pertinent negatives include no chest pain, myalgias or shortness of breath. Current antihyperlipidemic treatment includes statins. The current treatment provides moderate improvement of lipids. There are no compliance problems.   Asthma There is no chest tightness, cough, difficulty breathing, shortness of breath or wheezing. This is a chronic problem. Pertinent negatives include no appetite change, chest pain, headaches, myalgias, trouble swallowing or weight loss. Her symptoms are alleviated by beta-agonist. Her past medical history is significant for asthma.  Thyroid Problem Presents for follow-up visit. Symptoms include palpitations. Patient reports no cold intolerance, constipation, diarrhea, dry skin, fatigue, hair loss, heat intolerance, menstrual problem, nail problem, weight gain or weight loss.  Her past medical history is significant for hyperlipidemia.    Lab Results  Component Value Date   NA 139 03/03/2020   K 4.1 03/03/2020   CO2 29 03/03/2020   GLUCOSE 107 (H) 03/03/2020   BUN 9 03/03/2020   CREATININE 0.78 03/03/2020   CALCIUM 8.7 (L) 03/03/2020   GFRNONAA >60 03/03/2020   No results found for: "CHOL", "HDL", "LDLCALC", "LDLDIRECT", "TRIG", "CHOLHDL" Lab Results  Component Value Date   TSH 1.370 03/03/2020   No results found for: "HGBA1C" Lab Results  Component Value Date   WBC 8.3 03/03/2020   HGB 12.6 03/03/2020   HCT 39.3 03/03/2020   MCV 88.3 03/03/2020   PLT 226 03/03/2020   Lab Results  Component Value Date   ALT 18 03/03/2020   AST 23 03/03/2020   ALKPHOS 79 03/03/2020   BILITOT 0.6 03/03/2020   No results found for: "25OHVITD2", "25OHVITD3", "VD25OH"   Review of Systems  Constitutional:  Negative for appetite change, fatigue, unexpected weight change, weight gain and weight loss.  HENT:  Negative for trouble swallowing.   Eyes:  Negative for visual disturbance.  Respiratory:  Negative for cough, chest tightness, shortness of breath and wheezing.   Cardiovascular:  Positive for palpitations. Negative for chest pain.  Gastrointestinal:  Negative for constipation and diarrhea.  Endocrine: Negative for cold intolerance, heat intolerance, polydipsia and polyuria.  Genitourinary:  Negative for difficulty urinating, menstrual problem and vaginal bleeding.  Musculoskeletal:  Negative for myalgias.  Neurological:  Negative for headaches.  Psychiatric/Behavioral:  Positive for depression. Negative for decreased concentration and suicidal ideas. The patient does not have insomnia.     There are no problems to display for this patient.   Allergies  Allergen Reactions   Augmentin [Amoxicillin-Pot Clavulanate] Hives   Darvon [  Propoxyphene] Nausea And Vomiting   Demerol [Meperidine Hcl]     Past Surgical History:  Procedure Laterality Date    ABDOMINAL HYSTERECTOMY     CHOLECYSTECTOMY     heart ablation      Social History   Tobacco Use   Smoking status: Former    Packs/day: 1.50    Years: 20.00    Additional pack years: 0.00    Total pack years: 30.00    Types: Cigarettes    Quit date: 10/07/1996    Years since quitting: 26.2   Smokeless tobacco: Never  Vaping Use   Vaping Use: Never used  Substance Use Topics   Alcohol use: Never   Drug use: Never     Medication list has been reviewed and updated.  Current Meds  Medication Sig   albuterol (VENTOLIN HFA) 108 (90 Base) MCG/ACT inhaler Inhale 2-4 puffs by mouth every 4 hours as needed for wheezing, cough, and/or shortness of breath   aspirin EC 81 MG tablet Take 81 mg by mouth daily.   levothyroxine (SYNTHROID) 88 MCG tablet Take 88 mcg by mouth daily before breakfast.   metoprolol tartrate (LOPRESSOR) 50 MG tablet Take 50 mg by mouth daily. cardio   omeprazole (PRILOSEC) 20 MG capsule Take 20 mg by mouth 2 (two) times daily before a meal. GI   pravastatin (PRAVACHOL) 10 MG tablet Take 10 mg by mouth daily.   sertraline (ZOLOFT) 25 MG tablet Take 25 mg by mouth daily.   SUMAtriptan (IMITREX) 100 MG tablet Take 1 tablet (100 mg total) by mouth once for 1 dose. Repeat in 2 hrs x 1 if cont headache   traZODone (DESYREL) 50 MG tablet Take 50 mg by mouth at bedtime.       12/23/2022   10:57 AM 11/29/2022    1:26 PM  GAD 7 : Generalized Anxiety Score  Nervous, Anxious, on Edge 0 0  Control/stop worrying 0 0  Worry too much - different things 0 0  Trouble relaxing 0 0  Restless 0 0  Easily annoyed or irritable 0 0  Afraid - awful might happen 0 0  Total GAD 7 Score 0 0  Anxiety Difficulty Not difficult at all Not difficult at all       12/23/2022   10:57 AM 11/29/2022    1:25 PM  Depression screen PHQ 2/9  Decreased Interest 0 0  Down, Depressed, Hopeless 0 0  PHQ - 2 Score 0 0  Altered sleeping 0 0  Tired, decreased energy 0 1  Change in appetite 0 0   Feeling bad or failure about yourself  0 0  Trouble concentrating 0 0  Moving slowly or fidgety/restless 0 0  Suicidal thoughts 0 0  PHQ-9 Score 0 1  Difficult doing work/chores Not difficult at all Not difficult at all    BP Readings from Last 3 Encounters:  12/23/22 90/62  11/29/22 116/70  03/03/20 138/80    Physical Exam Vitals and nursing note reviewed. Exam conducted with a chaperone present.  Constitutional:      General: She is not irritable.She is not in acute distress.    Appearance: She is not diaphoretic.  HENT:     Head: Normocephalic and atraumatic.     Right Ear: External ear normal.     Left Ear: External ear normal.     Nose: Nose normal. No congestion or rhinorrhea.     Mouth/Throat:     Pharynx: No oropharyngeal exudate or posterior oropharyngeal  erythema.  Eyes:     General:        Right eye: No discharge.        Left eye: No discharge.     Conjunctiva/sclera: Conjunctivae normal.     Pupils: Pupils are equal, round, and reactive to light.  Neck:     Thyroid: No thyromegaly.     Vascular: No JVD.  Cardiovascular:     Rate and Rhythm: Normal rate and regular rhythm.     Heart sounds: Normal heart sounds. No murmur heard.    No friction rub. No gallop.  Pulmonary:     Effort: Pulmonary effort is normal.     Breath sounds: Normal breath sounds. No wheezing, rhonchi or rales.  Abdominal:     General: Bowel sounds are normal.     Palpations: Abdomen is soft. There is no mass.     Tenderness: There is no abdominal tenderness. There is no guarding.  Musculoskeletal:        General: Normal range of motion.     Cervical back: Normal range of motion and neck supple.  Lymphadenopathy:     Cervical: No cervical adenopathy.  Skin:    General: Skin is warm and dry.  Neurological:     Mental Status: She is alert.     Deep Tendon Reflexes: Reflexes are normal and symmetric.     Wt Readings from Last 3 Encounters:  12/23/22 218 lb (98.9 kg)  11/29/22  218 lb (98.9 kg)  03/03/20 200 lb (90.7 kg)    BP 90/62   Pulse 63   Ht 4' 11.5" (1.511 m)   Wt 218 lb (98.9 kg)   SpO2 94%   BMI 43.29 kg/m   Assessment and Plan:  1. Asthmatic bronchitis , chronic Chronic.  Controlled.  Stable.  Continue albuterol inhaler 2 to 4 puffs puffs every 4 hours as needed for wheezing cough or shortness of breath. - albuterol (VENTOLIN HFA) 108 (90 Base) MCG/ACT inhaler; Inhale 2-4 puffs by mouth every 4 hours as needed for wheezing, cough, and/or shortness of breath  Dispense: 8 g; Refill: 1  2. Prediabetes Chronic.  Controlled.  Stable.  Followed with A1c's.  Generally have remained in prediabetic range we will check A1c and renal function panel at this time. - HgB A1c - Renal Function Panel  3. Hypothyroidism due to acquired atrophy of thyroid .  Controlled.  Stable.  Pending TSH we will likely continue levothyroxine 88 mcg daily or adjust accordingly. - levothyroxine (SYNTHROID) 88 MCG tablet; Take 1 tablet (88 mcg total) by mouth daily before breakfast.  Dispense: 90 tablet; Refill: 1 - TSH  4. Recurrent major depressive disorder, in partial remission (HCC) Chronic.  Controlled.  Stable.  PHQ 0 GAD score 0 continue sertraline 25 mg once a day. - sertraline (ZOLOFT) 25 MG tablet; Take 1 tablet (25 mg total) by mouth daily.  Dispense: 90 tablet; Refill: 1  5. Hyperlipidemia, mixed Chronic.  Controlled.  Stable.  Pending lipid panel will likely continue pravastatin 10 mg once a day. - pravastatin (PRAVACHOL) 10 MG tablet; Take 1 tablet (10 mg total) by mouth daily.  Dispense: 90 tablet; Refill: 1 - Lipid Panel With LDL/HDL Ratio  6. Primary insomnia Chronic.  Controlled.  Stable.  Patient is having excellent results with trazodone 50 mg 1/2 to 1 tablet as needed for sleep. - traZODone (DESYREL) 50 MG tablet; Take 1 tablet (50 mg total) by mouth at bedtime.  Dispense: 90 tablet; Refill: 1  Otilio Miu, MD

## 2022-12-24 LAB — HEMOGLOBIN A1C
Est. average glucose Bld gHb Est-mCnc: 134 mg/dL
Hgb A1c MFr Bld: 6.3 % — ABNORMAL HIGH (ref 4.8–5.6)

## 2022-12-24 LAB — RENAL FUNCTION PANEL
Albumin: 4.3 g/dL (ref 3.8–4.8)
BUN/Creatinine Ratio: 15 (ref 12–28)
BUN: 12 mg/dL (ref 8–27)
CO2: 24 mmol/L (ref 20–29)
Calcium: 9.3 mg/dL (ref 8.7–10.3)
Chloride: 101 mmol/L (ref 96–106)
Creatinine, Ser: 0.81 mg/dL (ref 0.57–1.00)
Glucose: 115 mg/dL — ABNORMAL HIGH (ref 70–99)
Phosphorus: 3.3 mg/dL (ref 3.0–4.3)
Potassium: 4.6 mmol/L (ref 3.5–5.2)
Sodium: 142 mmol/L (ref 134–144)
eGFR: 77 mL/min/{1.73_m2} (ref >59)

## 2022-12-24 LAB — LIPID PANEL WITH LDL/HDL RATIO
Cholesterol, Total: 160 mg/dL (ref 100–199)
HDL: 53 mg/dL (ref >39)
LDL Chol Calc (NIH): 78 mg/dL (ref 0–99)
LDL/HDL Ratio: 1.5 ratio (ref 0.0–3.2)
Triglycerides: 172 mg/dL — ABNORMAL HIGH (ref 0–149)
VLDL Cholesterol Cal: 29 mg/dL (ref 5–40)

## 2022-12-24 LAB — TSH: TSH: 4.47 u[IU]/mL (ref 0.450–4.500)

## 2023-02-06 ENCOUNTER — Ambulatory Visit (INDEPENDENT_AMBULATORY_CARE_PROVIDER_SITE_OTHER): Payer: No Typology Code available for payment source | Admitting: Internal Medicine

## 2023-02-06 ENCOUNTER — Encounter (INDEPENDENT_AMBULATORY_CARE_PROVIDER_SITE_OTHER): Payer: Self-pay | Admitting: Internal Medicine

## 2023-02-06 VITALS — BP 122/68 | HR 60 | Temp 98.2°F | Ht 60.0 in | Wt 215.0 lb

## 2023-02-06 DIAGNOSIS — Z6841 Body Mass Index (BMI) 40.0 and over, adult: Secondary | ICD-10-CM

## 2023-02-06 DIAGNOSIS — Z0289 Encounter for other administrative examinations: Secondary | ICD-10-CM

## 2023-02-06 DIAGNOSIS — G4733 Obstructive sleep apnea (adult) (pediatric): Secondary | ICD-10-CM | POA: Diagnosis not present

## 2023-02-06 DIAGNOSIS — E782 Mixed hyperlipidemia: Secondary | ICD-10-CM | POA: Diagnosis not present

## 2023-02-06 DIAGNOSIS — R7303 Prediabetes: Secondary | ICD-10-CM

## 2023-02-06 NOTE — Assessment & Plan Note (Addendum)
Not on CPAP, got tired of it.  Counseled on the risks associated with untreated sleep apnea.  Losing 15% of body weight may reduce AHI

## 2023-02-06 NOTE — Assessment & Plan Note (Signed)
We reviewed weight, biometrics, associated medical conditions and contributing factors with patient. She would benefit from weight loss therapy via a modified calorie, low-carb, high-protein nutritional plan tailored to their REE (resting energy expenditure) which will be determined by indirect calorimetry.  We will also assess for cardiometabolic risk and nutritional derangements via fasting serologies at her next appointment. 

## 2023-02-06 NOTE — Progress Notes (Signed)
Office: [phone]  /  Fax: [phone]   Initial Visit  Nicole Robles was seen in clinic today to evaluate for obesity. She is interested in losing weight to improve overall health and reduce the risk of weight related complications. She presents today to review program treatment options, initial physical assessment, and evaluation.     She was referred by: PCP  When asked what else they would like to accomplish? She states: Adopt healthier eating patterns, Improve energy levels and physical activity, and Improve existing medical conditions  Weight history: gained weight after losing husband. Worked al [employer] for 20 + years. Likes   When asked how has your weight affected you? She states: Contributed to medical problems, Contributed to orthopedic problems or mobility issues, Having fatigue, and Having poor endurance  Some associated conditions: Hypertension, OSA, and Prediabetes  Contributing factors: Family history, Disruption of circadian rhythm, Nutritional, Medications, Reduced physical activity, Eating patterns, and Menopause  Weight promoting medications identified: Beta-blockers  Current nutrition plan: None  Current level of physical activity: None  Current or previous pharmacotherapy: None  Response to medication: Never tried medications   Past medical history includes:   Past Medical History:  Diagnosis Date   Depression    GERD (gastroesophageal reflux disease)    High cholesterol    SVT (supraventricular tachycardia)    Thyroid disease      Objective:   BP 122/68   Pulse 60   Temp 98.2 F (36.8 C)   Ht 5' (1.524 m)   Wt 215 lb (97.5 kg)   SpO2 96%   BMI 41.99 kg/m  She was weighed on the bioimpedance scale: Body mass index is 41.99 kg/m.  Peak Weight: 215 Body Fat%:52, Visceral Fat Rating:19, Weight trend over the last 12 months: Increasing  General:  Alert, oriented and cooperative. Patient is in no acute distress.  Respiratory: Normal  respiratory effort, no problems with respiration noted   Gait: able to ambulate independently  Mental Status: Normal mood and affect. Normal behavior. Normal judgment and thought content.   DIAGNOSTIC DATA REVIEWED:  BMET    Component Value Date/Time   NA 142 12/23/2022 1158   K 4.6 12/23/2022 1158   CL 101 12/23/2022 1158   CO2 24 12/23/2022 1158   GLUCOSE 115 (H) 12/23/2022 1158   GLUCOSE 107 (H) 03/03/2020 0407   BUN 12 12/23/2022 1158   CREATININE 0.81 12/23/2022 1158   CALCIUM 9.3 12/23/2022 1158   GFRNONAA >60 03/03/2020 0407   GFRAA >60 03/03/2020 0407   Lab Results  Component Value Date   HGBA1C 6.3 (H) 12/23/2022   No results found for: "INSULIN" CBC    Component Value Date/Time   WBC 8.3 03/03/2020 0407   RBC 4.45 03/03/2020 0407   HGB 12.6 03/03/2020 0407   HCT 39.3 03/03/2020 0407   PLT 226 03/03/2020 0407   MCV 88.3 03/03/2020 0407   MCH 28.3 03/03/2020 0407   MCHC 32.1 03/03/2020 0407   RDW 13.2 03/03/2020 0407   Iron/TIBC/Ferritin/ %Sat No results found for: "IRON", "TIBC", "FERRITIN", "IRONPCTSAT" Lipid Panel     Component Value Date/Time   CHOL 160 12/23/2022 1158   TRIG 172 (H) 12/23/2022 1158   HDL 53 12/23/2022 1158   LDLCALC 78 12/23/2022 1158   Hepatic Function Panel     Component Value Date/Time   PROT 7.2 03/03/2020 0407   ALBUMIN 4.3 12/23/2022 1158   AST 23 03/03/2020 0407   ALT 18 03/03/2020 0407   ALKPHOS 79  03/03/2020 0407   BILITOT 0.6 03/03/2020 0407      Component Value Date/Time   TSH 4.470 12/23/2022 1158     Assessment and Plan:   OSA (obstructive sleep apnea) Assessment & Plan: Not on CPAP, got tired of it.  Counseled on the risks associated with untreated sleep apnea.  Losing 15% of body weight may reduce AHI   Prediabetes Assessment & Plan: Most recent A1c is  Lab Results  Component Value Date   HGBA1C 6.3 (H) 12/23/2022  . Patient informed of disease state and risk of progression. This may contribute  to abnormal cravings, fatigue and diabetes complications without having diabetes.   We reviewed treatment options which include losing 7 to 10% of body weight, increasing physical activity to a 150 minutes a week of moderate intensity.She may also be a candidate for pharmacoprophylaxis with metformin or incretin mimetic.     Hyperlipidemia, mixed Assessment & Plan: LDL is at goal. Elevated LDL may be secondary to nutrition, genetics and spillover effect from excess adiposity. Recommended LDL goal is <70 to reduce the risk of fatty streaks and the progression to obstructive ASCVD in the future. Her 10 year risk is: The 10-year ASCVD risk score (Arnett DK, et al., 2019) is: 11.5%  Lab Results  Component Value Date   CHOL 160 12/23/2022   HDL 53 12/23/2022   LDLCALC 78 12/23/2022   TRIG 172 (H) 12/23/2022    Continue weight loss therapy, losing 10% or more of body weight may improve condition. Also advised to reduce saturated fats in diet to less than 10% of daily calories.        Class 3 severe obesity with serious comorbidity and body mass index (BMI) of 40.0 to 44.9 in adult, unspecified obesity type [facility]) Assessment & Plan: We reviewed weight, biometrics, associated medical conditions and contributing factors with patient. She would benefit from weight loss therapy via a modified calorie, low-carb, high-protein nutritional plan tailored to their REE (resting energy expenditure) which will be determined by indirect calorimetry.  We will also assess for cardiometabolic risk and nutritional derangements via fasting serologies at her next appointment.         Obesity Treatment / Action Plan:  Patient will work on garnering support from family and friends to begin weight loss journey. Will work on eliminating or reducing the presence of highly palatable, calorie dense foods in the home. Will complete provided nutritional and psychosocial assessment questionnaire before the next  appointment. Will be scheduled for indirect calorimetry to determine resting energy expenditure in a fasting state.  This will allow [place] to create a reduced calorie, high-protein meal plan to promote loss of fat mass while preserving muscle mass. Counseled on the health benefits of losing 5%-15% of total body weight. Was counseled on nutritional approaches to weight loss and benefits of complex carbs and high quality protein as part of nutritional weight management. Was counseled on pharmacotherapy and role as an adjunct in weight management.   Obesity Education Performed Today:  She was weighed on the bioimpedance scale and results were discussed and documented in the synopsis.  We discussed obesity as a disease and the importance of a more detailed evaluation of all the factors contributing to the disease.  We discussed the importance of long term lifestyle changes which include nutrition, exercise and behavioral modifications as well as the importance of customizing this to her specific health and social needs.  We discussed the benefits of reaching a healthier weight to  alleviate the symptoms of existing conditions and reduce the risks of the biomechanical, metabolic and psychological effects of obesity.  Nicole Robles appears to be in the action stage of change and states they are ready to start intensive lifestyle modifications and behavioral modifications.  30 minutes was spent today on this visit including the above counseling, pre-visit chart review, and post-visit documentation.  Reviewed by clinician on day of visit: allergies, medications, problem list, medical history, surgical history, family history, social history, and previous encounter notes pertinent to obesity diagnosis.   Nicole Rancher, MD

## 2023-02-06 NOTE — Assessment & Plan Note (Signed)
Most recent A1c is  Lab Results  Component Value Date   HGBA1C 6.3 (H) 12/23/2022  . Patient informed of disease state and risk of progression. This may contribute to abnormal cravings, fatigue and diabetes complications without having diabetes.   We reviewed treatment options which include losing 7 to 10% of body weight, increasing physical activity to a 150 minutes a week of moderate intensity.She may also be a candidate for pharmacoprophylaxis with metformin or incretin mimetic.

## 2023-02-06 NOTE — Assessment & Plan Note (Signed)
LDL is at goal. Elevated LDL may be secondary to nutrition, genetics and spillover effect from excess adiposity. Recommended LDL goal is <70 to reduce the risk of fatty streaks and the progression to obstructive ASCVD in the future. Her 10 year risk is: The 10-year ASCVD risk score (Arnett DK, et al., 2019) is: 11.5%  Lab Results  Component Value Date   CHOL 160 12/23/2022   HDL 53 12/23/2022   LDLCALC 78 12/23/2022   TRIG 172 (H) 12/23/2022    Continue weight loss therapy, losing 10% or more of body weight may improve condition. Also advised to reduce saturated fats in diet to less than 10% of daily calories.

## 2023-02-19 ENCOUNTER — Ambulatory Visit (INDEPENDENT_AMBULATORY_CARE_PROVIDER_SITE_OTHER): Payer: No Typology Code available for payment source | Admitting: Internal Medicine

## 2023-02-19 ENCOUNTER — Encounter (INDEPENDENT_AMBULATORY_CARE_PROVIDER_SITE_OTHER): Payer: Self-pay | Admitting: Internal Medicine

## 2023-02-19 VITALS — BP 132/80 | HR 61 | Temp 98.4°F | Ht 60.0 in | Wt 215.0 lb

## 2023-02-19 DIAGNOSIS — R5383 Other fatigue: Secondary | ICD-10-CM | POA: Diagnosis not present

## 2023-02-19 DIAGNOSIS — R0602 Shortness of breath: Secondary | ICD-10-CM

## 2023-02-19 DIAGNOSIS — Z6841 Body Mass Index (BMI) 40.0 and over, adult: Secondary | ICD-10-CM

## 2023-02-19 DIAGNOSIS — R7303 Prediabetes: Secondary | ICD-10-CM

## 2023-02-19 DIAGNOSIS — G4733 Obstructive sleep apnea (adult) (pediatric): Secondary | ICD-10-CM

## 2023-02-19 DIAGNOSIS — E782 Mixed hyperlipidemia: Secondary | ICD-10-CM | POA: Diagnosis not present

## 2023-02-19 DIAGNOSIS — Z1331 Encounter for screening for depression: Secondary | ICD-10-CM

## 2023-02-19 DIAGNOSIS — F32A Depression, unspecified: Secondary | ICD-10-CM | POA: Diagnosis not present

## 2023-02-19 NOTE — Progress Notes (Unsigned)
Chief Complaint:   OBESITY Nicole Robles (MR# [id number]) is a 72 y.o. female who presents for evaluation and treatment of obesity and related comorbidities. Current BMI is Body mass index is 41.99 kg/m. Nicole Robles has been struggling with her weight for many years and has been unsuccessful in either losing weight, maintaining weight loss, or reaching her healthy weight goal.  Nicole Robles is currently in the action stage of change and ready to dedicate time achieving and maintaining a healthier weight. Nicole Robles is interested in becoming our patient and working on intensive lifestyle modifications including (but not limited to) diet and exercise for weight loss.  Nicole Robles's habits were reviewed today and are as follows: Her family eats meals together, her desired weight loss is 60 lbs, she started gaining weight after thyroid issues and retirement, her heaviest weight ever was 215 pounds, she has significant food cravings issues, she snacks frequently in the evenings, she wakes up frequently in the middle of the night to eat, and she is frequently drinking liquids with calories.  Depression Screen Nicole Robles's Food and Mood (modified PHQ-9) score was 3.  Subjective:   1. Other fatigue Nicole Robles admits to daytime somnolence and admits to waking up still tired. Patient has a history of symptoms of daytime fatigue, morning fatigue, and morning headache. Nicole Robles generally gets 6 or 7 hours of sleep per night, and states that she has nightime awakenings. Snoring is present. Apneic episodes are not present. Epworth Sleepiness Score is 10.   2. SOB (shortness of breath) on exertion Nicole Robles notes increasing shortness of breath with exercising and seems to be worsening over time with weight gain. She notes getting out of breath sooner with activity than she used to. This has not gotten worse recently. Nicole Robles denies shortness of breath at rest or orthopnea.  3. Prediabetes Patient aware of disease state  and risk of progression. This may contribute to abnormal cravings, fatigue and diabetic complications without having diabetes. Most recent A1c is  Lab Results  Component Value Date   HGBA1C 6.3 (H) 12/23/2022   4. OSA (obstructive sleep apnea) Patient reports suboptimal adherence to PAP therapy.  5. Hyperlipidemia, mixed Patient reports having a heart attack in the past which is not documented in her records.  EKG does not show any Q waves.  She is currently on aspirin 81 mg a day, metoprolol extended release 100 mg a day (this may cause weight gain), pravastatin 10 mg a day (low intensity statin).  Lab Results  Component Value Date   CHOL 160 12/23/2022   HDL 53 12/23/2022   LDLCALC 78 12/23/2022   TRIG 172 (H) 12/23/2022   Assessment/Plan:   1. Other fatigue Nicole Robles does feel that her weight is causing her energy to be lower than it should be. Fatigue may be related to obesity, depression or many other causes. Labs will be ordered, and in the meanwhile, Nicole Robles will focus on self care including making healthy food choices, increasing physical activity and focusing on stress reduction.  - EKG 12-Lead - Vitamin B12 - CBC with Differential/Platelet  2. SOB (shortness of breath) on exertion Nicole Robles does feel that she gets out of breath more easily that she used to when she exercises. Nicole Robles shortness of breath appears to be obesity related and exercise induced. She has agreed to work on weight loss and gradually increase exercise to treat her exercise induced shortness of breath. Will continue to monitor closely.  3. Prediabetes We reviewed treatment options which includes  losing 7 to 10% of body weight, increasing physical activity to a goal of 150 minutes a week at moderate intensity. She may also be a candidate for pharmacoprophylaxis with metformin or incretin mimetic.   - Comprehensive metabolic panel - Insulin, random  4. OSA (obstructive sleep apnea) She was counseled on  the risk associated with untreated sleep apnea.  Losing 15% of body weight may reduce AHI.  5. Hyperlipidemia, mixed Considering increased cardiovascular risk patient will benefit from moderate to high intensity statin therapy with a goal LDL of less than 70.  I will defer statin titration to primary care team. Losing 10% or more of body weight may improve condition. Also advised to reduce saturated fats in diet to less than 10% of daily calories.   6. Depression screen Nicole Robles had a positive depression screening. Depression is commonly associated with obesity and often results in emotional eating behaviors. We will monitor this closely and work on CBT to help improve the non-hunger eating patterns. Referral to Psychology may be required if no improvement is seen as she continues in our clinic.  7. Class 3 severe obesity with serious comorbidity and body mass index (BMI) of 40.0 to 44.9 in adult, unspecified obesity type (HCC) BMR: 1481. Contributing factors: Age, menopause, beta blocker, eating processed foods, decreased PA, chronic skipping. Associated conditions: OSA, Pre-DM, HLD. Nutritional: Cat 1 plus 100. Physical activity: To be addressed at the next office visit. Pharmacotherapy: None.  - VITAMIN D 25 Hydroxy (Vit-D Deficiency, Fractures)  Nicole Robles is currently in the action stage of change and her goal is to continue with weight loss efforts. I recommend Nicole Robles begin the structured treatment plan as follows:  She has agreed to the Category 1 Plan + 100 calories and keeping a food journal and adhering to recommended goals of 1100 calories and 80-90 grams of protein daily.  Exercise goals: All adults should avoid inactivity. Some physical activity is better than none, and adults who participate in any amount of physical activity gain some health benefits.   Behavioral modification strategies: increasing lean protein intake, decreasing simple carbohydrates, increasing vegetables, increasing  water intake, decreasing liquid calories, increasing high fiber foods, decreasing eating out, no skipping meals, meal planning and cooking strategies, keeping healthy foods in the home, better snacking choices, avoiding temptations, and planning for success.  She was informed of the importance of frequent follow-up visits to maximize her success with intensive lifestyle modifications for her multiple health conditions. She was informed we would discuss her lab results at her next visit unless there is a critical issue that needs to be addressed sooner. Krysia agreed to keep her next visit at the agreed upon time to discuss these results.  Objective:   Blood pressure 132/80, pulse 61, temperature 98.4 F (36.9 C), height 5' (1.524 m), weight 215 lb (97.5 kg), SpO2 96 %. Body mass index is 41.99 kg/m.  EKG: Normal sinus rhythm, rate 80 BPM.  Indirect Calorimeter completed today shows a VO2 of 238 and a REE of 1642.  Her calculated basal metabolic rate is 2440 thus her basal metabolic rate is better than expected.  General: Cooperative, alert, well developed, in no acute distress. HEENT: Conjunctivae and lids unremarkable. Cardiovascular: Regular rhythm.  Lungs: Normal work of breathing. Neurologic: No focal deficits.   Lab Results  Component Value Date   CREATININE 0.85 02/19/2023   BUN 11 02/19/2023   NA 142 02/19/2023   K 4.1 02/19/2023   CL 101 02/19/2023   CO2  24 02/19/2023   Lab Results  Component Value Date   ALT 27 02/19/2023   AST 29 02/19/2023   ALKPHOS 82 02/19/2023   BILITOT 0.4 02/19/2023   Lab Results  Component Value Date   HGBA1C 6.3 (H) 12/23/2022   Lab Results  Component Value Date   INSULIN 23.8 02/19/2023   Lab Results  Component Value Date   TSH 4.470 12/23/2022   Lab Results  Component Value Date   CHOL 160 12/23/2022   HDL 53 12/23/2022   LDLCALC 78 12/23/2022   TRIG 172 (H) 12/23/2022   Lab Results  Component Value Date   WBC 9.0  02/19/2023   HGB 12.7 02/19/2023   HCT 40.5 02/19/2023   MCV 89 02/19/2023   PLT 212 02/19/2023   No results found for: "IRON", "TIBC", "FERRITIN"  Attestation Statements:   Reviewed by clinician on day of visit: allergies, medications, problem list, medical history, surgical history, family history, social history, and previous encounter notes.  Time spent on visit including pre-visit chart review and post-visit charting and care was 40 minutes.   Trude Mcburney, am acting as transcriptionist for Worthy Rancher, MD.  I have reviewed the above documentation for accuracy and completeness, and I agree with the above. -Worthy Rancher, MD

## 2023-02-19 NOTE — Assessment & Plan Note (Signed)
Patient reports suboptimal adherence to PAP therapy.  She was counseled on the risk associated with untreated sleep apnea.  Losing 15% of body weight may reduce AHI.

## 2023-02-19 NOTE — Assessment & Plan Note (Signed)
Most recent A1c is  Lab Results  Component Value Date   HGBA1C 6.3 (H) 12/23/2022    Patient aware of disease state and risk of progression. This may contribute to abnormal cravings, fatigue and diabetic complications without having diabetes.   We reviewed treatment options which includes losing 7 to 10% of body weight, increasing physical activity to a goal of 150 minutes a week at moderate intensity. She may also be a candidate for pharmacoprophylaxis with metformin or incretin mimetic.

## 2023-02-19 NOTE — Assessment & Plan Note (Signed)
Patient reports having a heart attack in the past which is not documented in her records.  EKG does not show any Q waves.  She is currently on aspirin 81 mg a day, metoprolol extended release 100 mg a day (this may cause weight gain), pravastatin 10 mg a day (low intensity statin)  Lab Results  Component Value Date   CHOL 160 12/23/2022   HDL 53 12/23/2022   LDLCALC 78 12/23/2022   TRIG 172 (H) 12/23/2022    Considering increased cardiovascular risk patient will benefit from moderate to high intensity statin therapy with a goal LDL of less than 70.  I will defer statin titration to primary care team. Losing 10% or more of body weight may improve condition. Also advised to reduce saturated fats in diet to less than 10% of daily calories.

## 2023-02-19 NOTE — Assessment & Plan Note (Addendum)
BMR: 1481 Contributing factors: Age, menopause, beta blocker, eating processed foods, decreased PA, chronic skipping. Associated conditions: OSA, Pre-DM, HLD Nutritional: Cat 1 plus 100 Physical activity: To be addressed at the next office visit Pharmacotherapy: None

## 2023-02-20 LAB — COMPREHENSIVE METABOLIC PANEL
ALT: 27 IU/L (ref 0–32)
AST: 29 IU/L (ref 0–40)
Albumin/Globulin Ratio: 1.6 (ref 1.2–2.2)
Albumin: 4.1 g/dL (ref 3.8–4.8)
Alkaline Phosphatase: 82 IU/L (ref 44–121)
BUN/Creatinine Ratio: 13 (ref 12–28)
BUN: 11 mg/dL (ref 8–27)
Bilirubin Total: 0.4 mg/dL (ref 0.0–1.2)
CO2: 24 mmol/L (ref 20–29)
Calcium: 9 mg/dL (ref 8.7–10.3)
Chloride: 101 mmol/L (ref 96–106)
Creatinine, Ser: 0.85 mg/dL (ref 0.57–1.00)
Globulin, Total: 2.6 g/dL (ref 1.5–4.5)
Glucose: 99 mg/dL (ref 70–99)
Potassium: 4.1 mmol/L (ref 3.5–5.2)
Sodium: 142 mmol/L (ref 134–144)
Total Protein: 6.7 g/dL (ref 6.0–8.5)
eGFR: 72 mL/min/{1.73_m2} (ref >59)

## 2023-02-20 LAB — VITAMIN B12: Vitamin B-12: >2000 pg/mL — ABNORMAL HIGH (ref 232–1245)

## 2023-02-20 LAB — CBC WITH DIFFERENTIAL/PLATELET
Basophils Absolute: 0.1 10*3/uL (ref 0.0–0.2)
Basos: 1 %
EOS (ABSOLUTE): 0.3 10*3/uL (ref 0.0–0.4)
Eos: 3 %
Hematocrit: 40.5 % (ref 34.0–46.6)
Hemoglobin: 12.7 g/dL (ref 11.1–15.9)
Immature Grans (Abs): 0 10*3/uL (ref 0.0–0.1)
Immature Granulocytes: 0 %
Lymphocytes Absolute: 2.6 10*3/uL (ref 0.7–3.1)
Lymphs: 29 %
MCH: 28 pg (ref 26.6–33.0)
MCHC: 31.4 g/dL — ABNORMAL LOW (ref 31.5–35.7)
MCV: 89 fL (ref 79–97)
Monocytes Absolute: 0.5 10*3/uL (ref 0.1–0.9)
Monocytes: 6 %
Neutrophils Absolute: 5.5 10*3/uL (ref 1.4–7.0)
Neutrophils: 61 %
Platelets: 212 10*3/uL (ref 150–450)
RBC: 4.54 x10E6/uL (ref 3.77–5.28)
RDW: 12.9 % (ref 11.7–15.4)
WBC: 9 10*3/uL (ref 3.4–10.8)

## 2023-02-20 LAB — INSULIN, RANDOM: INSULIN: 23.8 u[IU]/mL (ref 2.6–24.9)

## 2023-02-20 LAB — VITAMIN D 25 HYDROXY (VIT D DEFICIENCY, FRACTURES): Vit D, 25-Hydroxy: 27 ng/mL — ABNORMAL LOW (ref 30.0–100.0)

## 2023-03-05 ENCOUNTER — Encounter (INDEPENDENT_AMBULATORY_CARE_PROVIDER_SITE_OTHER): Payer: Self-pay | Admitting: Internal Medicine

## 2023-03-05 ENCOUNTER — Other Ambulatory Visit (INDEPENDENT_AMBULATORY_CARE_PROVIDER_SITE_OTHER): Payer: Self-pay | Admitting: Internal Medicine

## 2023-03-05 ENCOUNTER — Ambulatory Visit (INDEPENDENT_AMBULATORY_CARE_PROVIDER_SITE_OTHER): Payer: No Typology Code available for payment source | Admitting: Internal Medicine

## 2023-03-05 VITALS — BP 121/75 | HR 78 | Temp 98.1°F | Ht 60.0 in | Wt 209.0 lb

## 2023-03-05 DIAGNOSIS — E559 Vitamin D deficiency, unspecified: Secondary | ICD-10-CM

## 2023-03-05 DIAGNOSIS — G4733 Obstructive sleep apnea (adult) (pediatric): Secondary | ICD-10-CM

## 2023-03-05 DIAGNOSIS — Z723 Lack of physical exercise: Secondary | ICD-10-CM | POA: Insufficient documentation

## 2023-03-05 DIAGNOSIS — R7303 Prediabetes: Secondary | ICD-10-CM

## 2023-03-05 DIAGNOSIS — E782 Mixed hyperlipidemia: Secondary | ICD-10-CM | POA: Diagnosis not present

## 2023-03-05 DIAGNOSIS — Z6841 Body Mass Index (BMI) 40.0 and over, adult: Secondary | ICD-10-CM

## 2023-03-05 MED ORDER — METFORMIN HCL ER 500 MG PO TB24: 500.0000 mg | ORAL_TABLET | Freq: Every day | ORAL | 0 refills | Status: DC

## 2023-03-05 MED ORDER — VITAMIN D (ERGOCALCIFEROL) 1.25 MG (50000 UNIT) PO CAPS: 50000.0000 [IU] | ORAL_CAPSULE | ORAL | 0 refills | Status: DC

## 2023-03-05 NOTE — Assessment & Plan Note (Signed)
Most recent A1c is  Lab Results  Component Value Date   HGBA1C 6.3 (H) 12/23/2022    Patient aware of disease state and risk of progression. This may contribute to abnormal cravings, fatigue and diabetic complications without having diabetes.   We reviewed treatment options which includes losing 7 to 10% of body weight, increasing physical activity to a goal of 150 minutes a week at moderate intensity.  After discussion of benefits and side effects she is agreeable to starting metformin XR 500 mg 1 tablet daily for pharmacoprophylaxis.  Her daughter died from diabetes so she does not want to develop diabetes.

## 2023-03-05 NOTE — Assessment & Plan Note (Signed)
Patient reports having a heart attack in the past which is not documented in her records.  EKG does not show any Q waves.  She is currently on aspirin 81 mg a day, metoprolol extended release 100 mg a day (this may cause weight gain), pravastatin 10 mg a day (low intensity statin)  Lab Results  Component Value Date   CHOL 160 12/23/2022   HDL 53 12/23/2022   LDLCALC 78 12/23/2022   TRIG 172 (H) 12/23/2022    Considering increased cardiovascular risk patient will benefit from moderate to high intensity statin therapy with a goal LDL of less than 70.  I will defer statin titration to primary care team. Losing 10% or more of body weight may improve condition. Also advised to reduce saturated fats in diet to less than 10% of daily calories.      

## 2023-03-05 NOTE — Progress Notes (Signed)
Office: [phone]  /  Fax: [phone]  WEIGHT SUMMARY AND BIOMETRICS  Vitals Temp: 98.1 F (36.7 C) BP: 121/75 Pulse Rate: 78 SpO2: 96 %   Anthropometric Measurements Height: 5' (1.524 m) Weight: 209 lb (94.8 kg) BMI (Calculated): 40.82 Weight at Last Visit: 215lb Weight Lost Since Last Visit: 6lb Weight Gained Since Last Visit: 0 Starting Weight: 215lb Total Weight Loss (lbs): 6 lb (2.722 kg)   Body Composition  Body Fat %: 50.9 % Fat Mass (lbs): 106.6 lbs Muscle Mass (lbs): 97.6 lbs Total Body Water (lbs): 71 lbs Visceral Fat Rating : 18    No data recorded Today's Visit #: 2  Starting Date: 02/19/23   HPI  Chief Complaint: OBESITY  Nicole Robles is here to discuss her progress with her obesity treatment plan. She is on the the Category 1 Plan and states she is following her eating plan approximately 99 % of the time. She states she is exercising walking 5 minutes 7 times per week.  Interval History:  Since last office visit she has lost 6 lbs. She reports good adherence to reduced calorie nutritional plan. She has been working on not skipping meals, increasing protein intake at every meal, eating more fruits, eating more vegetables, drinking more water, and avoiding and or reducing liquid calories Denies problems with appetite and hunger signals.  Denies problems with satiety and satiation.  Denies problems with eating patterns and portion control.  Denies abnormal cravings. Denies feeling deprived or restricted.   Barriers identified: none.   Pharmacotherapy for weight loss: She is currently taking no anti-obesity medication.    ASSESSMENT AND PLAN  TREATMENT PLAN FOR OBESITY:  Recommended Dietary Goals  Nicole Robles is currently in the action stage of change. As such, her goal is to continue weight management plan. She has agreed to: continue current plan  Behavioral Intervention  We discussed the following Behavioral Modification Strategies  today: increasing lean protein intake, decreasing simple carbohydrates , increasing vegetables, increasing lower glycemic fruits, increasing water intake, continue to practice mindfulness when eating, and planning for success.  Additional resources provided today: None  Recommended Physical Activity Goals  Nicole Robles has been advised to work up to 150 minutes of moderate intensity aerobic activity a week and strengthening exercises 2-3 times per week for cardiovascular health, weight loss maintenance and preservation of muscle mass.   She has agreed to :  Think about ways to increase daily physical activity and overcoming barriers to exercise  Pharmacotherapy We discussed various medication options to help Nicole Robles with her weight loss efforts and we both agreed to : continue with nutritional and behavioral strategies  ASSOCIATED CONDITIONS ADDRESSED TODAY  Prediabetes Assessment & Plan: Most recent A1c is  Lab Results  Component Value Date   HGBA1C 6.3 (H) 12/23/2022    Patient aware of disease state and risk of progression. This may contribute to abnormal cravings, fatigue and diabetic complications without having diabetes.   We reviewed treatment options which includes losing 7 to 10% of body weight, increasing physical activity to a goal of 150 minutes a week at moderate intensity.  After discussion of benefits and side effects she is agreeable to starting metformin XR 500 mg 1 tablet daily for pharmacoprophylaxis.  Her daughter died from diabetes so she does not want to develop diabetes.   Orders: -     metFORMIN HCl ER; Take 1 tablet (500 mg total) by mouth daily with breakfast.  Dispense: 30 tablet; Refill: 0  OSA (obstructive sleep apnea)  Assessment & Plan: Patient reports suboptimal adherence to PAP therapy.  She was counseled on the risk associated with untreated sleep apnea.  Losing 15% of body weight may reduce AHI.   Hyperlipidemia, mixed Assessment & Plan: Patient  reports having a heart attack in the past which is not documented in her records.  EKG does not show any Q waves.  She is currently on aspirin 81 mg a day, metoprolol extended release 100 mg a day (this may cause weight gain), pravastatin 10 mg a day (low intensity statin)  Lab Results  Component Value Date   CHOL 160 12/23/2022   HDL 53 12/23/2022   LDLCALC 78 12/23/2022   TRIG 172 (H) 12/23/2022    Considering increased cardiovascular risk patient will benefit from moderate to high intensity statin therapy with a goal LDL of less than 70.  I will defer statin titration to primary care team. Losing 10% or more of body weight may improve condition. Also advised to reduce saturated fats in diet to less than 10% of daily calories.        Class 3 severe obesity with serious comorbidity and body mass index (BMI) of 40.0 to 44.9 in adult, unspecified obesity type (HCC)  Vitamin D deficiency Assessment & Plan: Most recent vitamin D levels  Lab Results  Component Value Date   VD25OH 27.0 (L) 02/19/2023     Deficiency state associated with adiposity and may result in leptin resistance, weight gain and fatigue.   Plan: After discussion of benefits, alternative treatment options and side effects patient will be started on vitamin D2 50,000 units 1 tablet weekly for 3-4 months. for a treatment goal level of 50-60 mg/dl. Check levels at that time for response monitoring.   Orders: -     Vitamin D (Ergocalciferol); Take 1 capsule (50,000 Units total) by mouth every 7 (seven) days.  Dispense: 13 capsule; Refill: 0  Physically inactive Assessment & Plan: Patient counseled on the benefits of regular physical activity.  She was provided handout on goals.  She will consider starting doing chair yoga and strengthening exercises.     PHYSICAL EXAM:  Blood pressure 121/75, pulse 78, temperature 98.1 F (36.7 C), height 5' (1.524 m), weight 209 lb (94.8 kg), SpO2 96 %. Body mass index is 40.82  kg/m.  General: She is overweight, cooperative, alert, well developed, and in no acute distress. PSYCH: Has normal mood, affect and thought process.   HEENT: EOMI, sclerae are anicteric. Lungs: Normal breathing effort, no conversational dyspnea. Extremities: No edema.  Neurologic: No gross sensory or motor deficits. No tremors or fasciculations noted.    DIAGNOSTIC DATA REVIEWED:  BMET    Component Value Date/Time   NA 142 02/19/2023 0941   K 4.1 02/19/2023 0941   CL 101 02/19/2023 0941   CO2 24 02/19/2023 0941   GLUCOSE 99 02/19/2023 0941   GLUCOSE 107 (H) 03/03/2020 0407   BUN 11 02/19/2023 0941   CREATININE 0.85 02/19/2023 0941   CALCIUM 9.0 02/19/2023 0941   GFRNONAA >60 03/03/2020 0407   GFRAA >60 03/03/2020 0407   Lab Results  Component Value Date   HGBA1C 6.3 (H) 12/23/2022   Lab Results  Component Value Date   INSULIN 23.8 02/19/2023   Lab Results  Component Value Date   TSH 4.470 12/23/2022   CBC    Component Value Date/Time   WBC 9.0 02/19/2023 0941   WBC 8.3 03/03/2020 0407   RBC 4.54 02/19/2023 0941   RBC 4.45  03/03/2020 0407   HGB 12.7 02/19/2023 0941   HCT 40.5 02/19/2023 0941   PLT 212 02/19/2023 0941   MCV 89 02/19/2023 0941   MCH 28.0 02/19/2023 0941   MCH 28.3 03/03/2020 0407   MCHC 31.4 (L) 02/19/2023 0941   MCHC 32.1 03/03/2020 0407   RDW 12.9 02/19/2023 0941   Iron Studies No results found for: "IRON", "TIBC", "FERRITIN", "IRONPCTSAT" Lipid Panel     Component Value Date/Time   CHOL 160 12/23/2022 1158   TRIG 172 (H) 12/23/2022 1158   HDL 53 12/23/2022 1158   LDLCALC 78 12/23/2022 1158   Hepatic Function Panel     Component Value Date/Time   PROT 6.7 02/19/2023 0941   ALBUMIN 4.1 02/19/2023 0941   AST 29 02/19/2023 0941   ALT 27 02/19/2023 0941   ALKPHOS 82 02/19/2023 0941   BILITOT 0.4 02/19/2023 0941      Component Value Date/Time   TSH 4.470 12/23/2022 1158   Nutritional Lab Results  Component Value Date    VD25OH 27.0 (L) 02/19/2023     Return in about 2 weeks (around 03/19/2023) for For Weight Mangement with Dr. Rikki Spearing.Marland Kitchen She was informed of the importance of frequent follow up visits to maximize her success with intensive lifestyle modifications for her multiple health conditions.   ATTESTASTION STATEMENTS:  Reviewed by clinician on day of visit: allergies, medications, problem list, medical history, surgical history, family history, social history, and previous encounter notes.     Worthy Rancher, MD

## 2023-03-05 NOTE — Assessment & Plan Note (Signed)
Patient reports suboptimal adherence to PAP therapy.  She was counseled on the risk associated with untreated sleep apnea.  Losing 15% of body weight may reduce AHI. 

## 2023-03-05 NOTE — Assessment & Plan Note (Signed)
Most recent vitamin D levels  Lab Results  Component Value Date   VD25OH 27.0 (L) 02/19/2023     Deficiency state associated with adiposity and may result in leptin resistance, weight gain and fatigue.   Plan: After discussion of benefits, alternative treatment options and side effects patient will be started on vitamin D2 50,000 units 1 tablet weekly for 3-4 months. for a treatment goal level of 50-60 mg/dl. Check levels at that time for response monitoring.

## 2023-03-05 NOTE — Assessment & Plan Note (Signed)
Patient counseled on the benefits of regular physical activity.  She was provided handout on goals.  She will consider starting doing chair yoga and strengthening exercises.

## 2023-03-19 ENCOUNTER — Telehealth: Payer: Self-pay | Admitting: Family Medicine

## 2023-03-19 NOTE — Telephone Encounter (Signed)
Copied from CRM [id number]. Topic: Referral - Status >> Mar 19, 2023  3:54 PM Macon Large wrote: Reason for CRM: Pt stated she was suppose to have a mammogram in March but she is being told a referral is needed. Pt requests that the referral be sent to [facility] location so she can be scheduled

## 2023-03-20 NOTE — Telephone Encounter (Signed)
Called and left VM informing patient per Delice Bison and Dr Yetta Barre that she will need to be seen first before we can order mammogram.  Patient is scheduled for 9/23 and we will do a breast exam that day first, before we can order mammogram. Told patient to call back with any questions.  - Marquett Bertoli

## 2023-03-26 ENCOUNTER — Ambulatory Visit (INDEPENDENT_AMBULATORY_CARE_PROVIDER_SITE_OTHER): Payer: No Typology Code available for payment source | Admitting: Internal Medicine

## 2023-03-26 ENCOUNTER — Other Ambulatory Visit (INDEPENDENT_AMBULATORY_CARE_PROVIDER_SITE_OTHER): Payer: Self-pay | Admitting: Internal Medicine

## 2023-03-26 ENCOUNTER — Encounter (INDEPENDENT_AMBULATORY_CARE_PROVIDER_SITE_OTHER): Payer: Self-pay | Admitting: Internal Medicine

## 2023-03-26 VITALS — BP 128/79 | HR 59 | Temp 98.1°F | Ht 60.0 in | Wt 207.0 lb

## 2023-03-26 DIAGNOSIS — G4733 Obstructive sleep apnea (adult) (pediatric): Secondary | ICD-10-CM

## 2023-03-26 DIAGNOSIS — R7303 Prediabetes: Secondary | ICD-10-CM

## 2023-03-26 DIAGNOSIS — Z6841 Body Mass Index (BMI) 40.0 and over, adult: Secondary | ICD-10-CM | POA: Diagnosis not present

## 2023-03-26 MED ORDER — METFORMIN HCL ER 500 MG PO TB24: 500.0000 mg | ORAL_TABLET | Freq: Two times a day (BID) | ORAL | 0 refills | Status: DC

## 2023-03-26 NOTE — Assessment & Plan Note (Signed)
Patient reports suboptimal adherence to PAP therapy.  She was counseled on the risk associated with untreated sleep apnea.  Losing 15% of body weight may reduce AHI. 

## 2023-03-26 NOTE — Assessment & Plan Note (Signed)
Most recent A1c is  Lab Results  Component Value Date   HGBA1C 6.3 (H) 12/23/2022    Patient aware of disease state and risk of progression. This may contribute to abnormal cravings, fatigue and diabetic complications without having diabetes.   We reviewed treatment options which includes losing 7 to 10% of body weight, increasing physical activity to a goal of 150 minutes a week at moderate intensity.  She is also on metformin for pharmacoprophylaxis.  We will increase medication to twice a day patient is tolerating medication well without any adverse effects this will also assist with weight management.

## 2023-03-26 NOTE — Progress Notes (Signed)
Office: [phone]  /  Fax: [phone]  WEIGHT SUMMARY AND BIOMETRICS  Vitals Temp: 98.1 F (36.7 C) BP: 128/79 Pulse Rate: (!) 59 SpO2: 96 %   Anthropometric Measurements Height: 5' (1.524 m) Weight: 207 lb (93.9 kg) BMI (Calculated): 40.43 Weight at Last Visit: 215 lb Weight Lost Since Last Visit: 2 lb Starting Weight: 215 lb Total Weight Loss (lbs): 8 lb (3.629 kg)   Body Composition  Body Fat %: 51 % Fat Mass (lbs): 105.8 lbs Muscle Mass (lbs): 96.6 lbs Total Body Water (lbs): 72.4 lbs Visceral Fat Rating : 18    No data recorded Today's Visit #: 3  Starting Date: 02/19/23   HPI  Chief Complaint: OBESITY  Irvin is here to discuss her progress with her obesity treatment plan. She is unsure of her plan and states she is following her eating plan approximately 99 % of the time. She states she is walking close to 3,000 steps 7 times per week.  Interval History:  Since last office visit she has lost 2 lbs. last office visit she was started on metformin XR 500 mg once a day.  She has noticed a reduction in cravings. She reports good adherence to reduced calorie nutritional plan. She has been working on not skipping meals, eating more fruits, eating more vegetables, drinking more water, avoiding and or reducing liquid calories, making healthier choices, begun to exercise, working on meal prepping, reducing portion sizes, and mindfulness around eating  Orixegenic Control: Denies problems with appetite and hunger signals.  Reports problems with satiety and satiation after lunch, she has been doing healthy choices but not the power bowls.  She has not been getting the prescribed amount of protein daily. Denies problems with eating patterns and portion control.  Denies abnormal cravings. Denies feeling deprived or restricted.   Barriers identified: orthopedic problems, medical conditions or chronic pain affecting mobility and medical comorbidities.    Pharmacotherapy for weight loss: She is currently taking Metformin (off label use for incretin effect and / or insulin resistance and / or diabetes prevention) with adequate clinical response  and without side effects..    ASSESSMENT AND PLAN  TREATMENT PLAN FOR OBESITY:  Recommended Dietary Goals  Nicole Robles is currently in the action stage of change. As such, her goal is to continue weight management plan. She has agreed to: continue current plan  Behavioral Intervention  We discussed the following Behavioral Modification Strategies today: increasing lean protein intake, decreasing simple carbohydrates , increasing vegetables, increasing lower glycemic fruits, increasing fiber rich foods, increasing water intake, continue to practice mindfulness when eating, and planning for success.  Additional resources provided today: None  Recommended Physical Activity Goals  Alba has been advised to work up to 150 minutes of moderate intensity aerobic activity a week and strengthening exercises 2-3 times per week for cardiovascular health, weight loss maintenance and preservation of muscle mass.   She has agreed to :  I congratulated her on her efforts.  She has several comorbid conditions including chronic back problems which limits her ability to engage in physical activity.  She has been working on increasing her steps daily and is up to 3000 with a goal of 5000 a day.  Pharmacotherapy We discussed various medication options to help Chelbie with her weight loss efforts and we both agreed to : continue current anti-obesity medication regimen  ASSOCIATED CONDITIONS ADDRESSED TODAY  Prediabetes Assessment & Plan: Most recent A1c is  Lab Results  Component Value Date  HGBA1C 6.3 (H) 12/23/2022    Patient aware of disease state and risk of progression. This may contribute to abnormal cravings, fatigue and diabetic complications without having diabetes.   We reviewed treatment options  which includes losing 7 to 10% of body weight, increasing physical activity to a goal of 150 minutes a week at moderate intensity.  She is also on metformin for pharmacoprophylaxis.  We will increase medication to twice a day patient is tolerating medication well without any adverse effects this will also assist with weight management.   Orders: -     metFORMIN HCl ER; Take 1 tablet (500 mg total) by mouth 2 (two) times daily with a meal.  Dispense: 60 tablet; Refill: 0  Class 3 severe obesity with serious comorbidity and body mass index (BMI) of 40.0 to 44.9 in adult, unspecified obesity type (HCC)  OSA (obstructive sleep apnea) Assessment & Plan: Patient reports suboptimal adherence to PAP therapy.  She was counseled on the risk associated with untreated sleep apnea.  Losing 15% of body weight may reduce AHI.     PHYSICAL EXAM:  Blood pressure 128/79, pulse (!) 59, temperature 98.1 F (36.7 C), height 5' (1.524 m), weight 207 lb (93.9 kg), SpO2 96 %. Body mass index is 40.43 kg/m.  General: She is overweight, cooperative, alert, well developed, and in no acute distress. PSYCH: Has normal mood, affect and thought process.   HEENT: EOMI, sclerae are anicteric. Lungs: Normal breathing effort, no conversational dyspnea. Extremities: No edema.  Neurologic: No gross sensory or motor deficits. No tremors or fasciculations noted.    DIAGNOSTIC DATA REVIEWED:  BMET    Component Value Date/Time   NA 142 02/19/2023 0941   K 4.1 02/19/2023 0941   CL 101 02/19/2023 0941   CO2 24 02/19/2023 0941   GLUCOSE 99 02/19/2023 0941   GLUCOSE 107 (H) 03/03/2020 0407   BUN 11 02/19/2023 0941   CREATININE 0.85 02/19/2023 0941   CALCIUM 9.0 02/19/2023 0941   GFRNONAA >60 03/03/2020 0407   GFRAA >60 03/03/2020 0407   Lab Results  Component Value Date   HGBA1C 6.3 (H) 12/23/2022   Lab Results  Component Value Date   INSULIN 23.8 02/19/2023   Lab Results  Component Value Date   TSH  4.470 12/23/2022   CBC    Component Value Date/Time   WBC 9.0 02/19/2023 0941   WBC 8.3 03/03/2020 0407   RBC 4.54 02/19/2023 0941   RBC 4.45 03/03/2020 0407   HGB 12.7 02/19/2023 0941   HCT 40.5 02/19/2023 0941   PLT 212 02/19/2023 0941   MCV 89 02/19/2023 0941   MCH 28.0 02/19/2023 0941   MCH 28.3 03/03/2020 0407   MCHC 31.4 (L) 02/19/2023 0941   MCHC 32.1 03/03/2020 0407   RDW 12.9 02/19/2023 0941   Iron Studies No results found for: "IRON", "TIBC", "FERRITIN", "IRONPCTSAT" Lipid Panel     Component Value Date/Time   CHOL 160 12/23/2022 1158   TRIG 172 (H) 12/23/2022 1158   HDL 53 12/23/2022 1158   LDLCALC 78 12/23/2022 1158   Hepatic Function Panel     Component Value Date/Time   PROT 6.7 02/19/2023 0941   ALBUMIN 4.1 02/19/2023 0941   AST 29 02/19/2023 0941   ALT 27 02/19/2023 0941   ALKPHOS 82 02/19/2023 0941   BILITOT 0.4 02/19/2023 0941      Component Value Date/Time   TSH 4.470 12/23/2022 1158   Nutritional Lab Results  Component Value Date  VD25OH 27.0 (L) 02/19/2023     Return in about 3 weeks (around 04/16/2023) for For Weight Mangement with Dr. Rikki Spearing.Marland Kitchen She was informed of the importance of frequent follow up visits to maximize her success with intensive lifestyle modifications for her multiple health conditions.   ATTESTASTION STATEMENTS:  Reviewed by clinician on day of visit: allergies, medications, problem list, medical history, surgical history, family history, social history, and previous encounter notes.     Worthy Rancher, MD

## 2023-03-31 ENCOUNTER — Ambulatory Visit: Payer: Self-pay | Admitting: Neurology

## 2023-04-03 ENCOUNTER — Other Ambulatory Visit: Payer: Self-pay | Admitting: Family Medicine

## 2023-04-03 DIAGNOSIS — J4489 Other specified chronic obstructive pulmonary disease: Secondary | ICD-10-CM

## 2023-04-16 DIAGNOSIS — Z8679 Personal history of other diseases of the circulatory system: Secondary | ICD-10-CM | POA: Diagnosis not present

## 2023-04-16 DIAGNOSIS — I4719 Other supraventricular tachycardia: Secondary | ICD-10-CM | POA: Diagnosis not present

## 2023-04-16 DIAGNOSIS — I2510 Atherosclerotic heart disease of native coronary artery without angina pectoris: Secondary | ICD-10-CM | POA: Diagnosis not present

## 2023-04-16 DIAGNOSIS — G4733 Obstructive sleep apnea (adult) (pediatric): Secondary | ICD-10-CM | POA: Diagnosis not present

## 2023-04-16 DIAGNOSIS — Z6839 Body mass index (BMI) 39.0-39.9, adult: Secondary | ICD-10-CM | POA: Diagnosis not present

## 2023-04-16 DIAGNOSIS — E782 Mixed hyperlipidemia: Secondary | ICD-10-CM | POA: Diagnosis not present

## 2023-04-16 DIAGNOSIS — Z9889 Other specified postprocedural states: Secondary | ICD-10-CM | POA: Diagnosis not present

## 2023-04-23 ENCOUNTER — Other Ambulatory Visit (INDEPENDENT_AMBULATORY_CARE_PROVIDER_SITE_OTHER): Payer: Self-pay | Admitting: Internal Medicine

## 2023-04-23 ENCOUNTER — Ambulatory Visit (INDEPENDENT_AMBULATORY_CARE_PROVIDER_SITE_OTHER): Payer: No Typology Code available for payment source | Admitting: Internal Medicine

## 2023-04-23 ENCOUNTER — Encounter (INDEPENDENT_AMBULATORY_CARE_PROVIDER_SITE_OTHER): Payer: Self-pay | Admitting: Internal Medicine

## 2023-04-23 VITALS — BP 133/65 | HR 62 | Temp 97.8°F | Ht 60.0 in | Wt 199.0 lb

## 2023-04-23 DIAGNOSIS — R7303 Prediabetes: Secondary | ICD-10-CM | POA: Diagnosis not present

## 2023-04-23 DIAGNOSIS — E559 Vitamin D deficiency, unspecified: Secondary | ICD-10-CM

## 2023-04-23 DIAGNOSIS — Z6838 Body mass index (BMI) 38.0-38.9, adult: Secondary | ICD-10-CM | POA: Diagnosis not present

## 2023-04-23 DIAGNOSIS — G4733 Obstructive sleep apnea (adult) (pediatric): Secondary | ICD-10-CM | POA: Diagnosis not present

## 2023-04-23 MED ORDER — METFORMIN HCL ER 500 MG PO TB24: 500.0000 mg | ORAL_TABLET | Freq: Two times a day (BID) | ORAL | 0 refills | Status: DC

## 2023-04-23 NOTE — Assessment & Plan Note (Addendum)
Most recent A1c is  Lab Results  Component Value Date   HGBA1C 6.3 (H) 12/23/2022    Patient aware of disease state and risk of progression. This may contribute to abnormal cravings, fatigue and diabetic complications without having diabetes.   We reviewed treatment options which includes losing 7 to 10% of body weight, increasing physical activity to a goal of 150 minutes a week at moderate intensity.  She is also on metformin for pharmacoprophylaxis without side effects.  Will continue metformin XR 500 mg twice a day.

## 2023-04-23 NOTE — Progress Notes (Signed)
Office: [phone]  /  Fax: [phone]  WEIGHT SUMMARY AND BIOMETRICS  Vitals Temp: 97.8 F (36.6 C) BP: 133/65 Pulse Rate: 62 SpO2: 96 %   Anthropometric Measurements Height: 5' (1.524 m) Weight: 199 lb (90.3 kg) BMI (Calculated): 38.86 Weight at Last Visit: 207 lb Weight Lost Since Last Visit: 8 lb Weight Gained Since Last Visit: 0 Starting Weight: 215 lb Total Weight Loss (lbs): 16 lb (7.258 kg)   Body Composition  Body Fat %: 48.6 % Fat Mass (lbs): 97.2 lbs Muscle Mass (lbs): 97.4 lbs Total Body Water (lbs): 68.8 lbs Visceral Fat Rating : 17    No data recorded Today's Visit #: 4  Starting Date: 02/19/23   HPI  Chief Complaint: OBESITY  Lylah is here to discuss her progress with her obesity treatment plan. She is on the the Category 1 Plan and states she is following her eating plan approximately 98 % of the time. She states she is exercising walking about 6,000 steps in 60 minutes 7 times per week.  Interval History:  Since last office visit she has lost 8 lbs. She reports good adherence to reduced calorie nutritional plan. She has been working on not skipping meals, increasing protein intake at every meal, eating more vegetables, drinking more water, and continues to exercise. Reading labels.  Orixegenic Control: Denies problems with appetite and hunger signals.  Denies problems with satiety and satiation.  Denies problems with eating patterns and portion control.  Denies abnormal cravings. Denies feeling deprived or restricted.   Barriers identified: none.   Pharmacotherapy for weight loss: She is currently taking Metformin (off label use for incretin effect and / or insulin resistance and / or diabetes prevention) with adequate clinical response  and without side effects..    ASSESSMENT AND PLAN  TREATMENT PLAN FOR OBESITY:  Recommended Dietary Goals  Wanza is currently in the action stage of change. As such, her goal is to  continue weight management plan. She has agreed to: continue current plan. Talked about microdosing with okra due to intolerance.  Behavioral Intervention  We discussed the following Behavioral Modification Strategies today: increasing lean protein intake, decreasing simple carbohydrates , increasing vegetables, increasing lower glycemic fruits, increasing fiber rich foods, increasing water intake, continue to practice mindfulness when eating, and planning for success.  Additional resources provided today: None  Recommended Physical Activity Goals  Lineth has been advised to work up to 150 minutes of moderate intensity aerobic activity a week and strengthening exercises 2-3 times per week for cardiovascular health, weight loss maintenance and preservation of muscle mass.   She has agreed to :  Continue current level of physical activity   Pharmacotherapy We discussed various medication options to help Nesreen with her weight loss efforts and we both agreed to : continue current anti-obesity medication regimen  ASSOCIATED CONDITIONS ADDRESSED TODAY  Class 3 severe obesity with serious comorbidity and body mass index (BMI) of 40.0 to 44.9 in adult, unspecified obesity type [facility])  Prediabetes Assessment & Plan: Most recent A1c is  Lab Results  Component Value Date   HGBA1C 6.3 (H) 12/23/2022    Patient aware of disease state and risk of progression. This may contribute to abnormal cravings, fatigue and diabetic complications without having diabetes.   We reviewed treatment options which includes losing 7 to 10% of body weight, increasing physical activity to a goal of 150 minutes a week at moderate intensity.  She is also on metformin for pharmacoprophylaxis without side effects.  Will continue metformin XR 500 mg twice a day.  Orders: -     metFORMIN HCl ER; Take 1 tablet (500 mg total) by mouth 2 (two) times daily with a meal.  Dispense: 60 tablet; Refill: 0  Vitamin D  deficiency Assessment & Plan: Most recent vitamin D levels  Lab Results  Component Value Date   VD25OH 27.0 (L) 02/19/2023     Deficiency state associated with adiposity and may result in leptin resistance, weight gain and fatigue.  On high-dose vitamin D supplementation without any adverse effects  Plan: Continue supplementation until August check levels at that time and transition to over-the-counter supplementation if levels are replenished.   OSA (obstructive sleep apnea) Assessment & Plan: Patient reports suboptimal adherence to PAP therapy.  She cannot tolerate the mask.  She was counseled on the risk associated with untreated sleep apnea.  She is doing a good job with weight loss.  Losing 15% of body weight may reduce AHI.     PHYSICAL EXAM:  Blood pressure 133/65, pulse 62, temperature 97.8 F (36.6 C), height 5' (1.524 m), weight 199 lb (90.3 kg), SpO2 96%. Body mass index is 38.86 kg/m.  General: She is overweight, cooperative, alert, well developed, and in no acute distress. PSYCH: Has normal mood, affect and thought process.   HEENT: EOMI, sclerae are anicteric. Lungs: Normal breathing effort, no conversational dyspnea. Extremities: No edema.  Neurologic: No gross sensory or motor deficits. No tremors or fasciculations noted.    DIAGNOSTIC DATA REVIEWED:  BMET    Component Value Date/Time   NA 142 02/19/2023 0941   K 4.1 02/19/2023 0941   CL 101 02/19/2023 0941   CO2 24 02/19/2023 0941   GLUCOSE 99 02/19/2023 0941   GLUCOSE 107 (H) 03/03/2020 0407   BUN 11 02/19/2023 0941   CREATININE 0.85 02/19/2023 0941   CALCIUM 9.0 02/19/2023 0941   GFRNONAA >60 03/03/2020 0407   GFRAA >60 03/03/2020 0407   Lab Results  Component Value Date   HGBA1C 6.3 (H) 12/23/2022   Lab Results  Component Value Date   INSULIN 23.8 02/19/2023   Lab Results  Component Value Date   TSH 4.470 12/23/2022   CBC    Component Value Date/Time   WBC 9.0 02/19/2023 0941    WBC 8.3 03/03/2020 0407   RBC 4.54 02/19/2023 0941   RBC 4.45 03/03/2020 0407   HGB 12.7 02/19/2023 0941   HCT 40.5 02/19/2023 0941   PLT 212 02/19/2023 0941   MCV 89 02/19/2023 0941   MCH 28.0 02/19/2023 0941   MCH 28.3 03/03/2020 0407   MCHC 31.4 (L) 02/19/2023 0941   MCHC 32.1 03/03/2020 0407   RDW 12.9 02/19/2023 0941   Iron Studies No results found for: "IRON", "TIBC", "FERRITIN", "IRONPCTSAT" Lipid Panel     Component Value Date/Time   CHOL 160 12/23/2022 1158   TRIG 172 (H) 12/23/2022 1158   HDL 53 12/23/2022 1158   LDLCALC 78 12/23/2022 1158   Hepatic Function Panel     Component Value Date/Time   PROT 6.7 02/19/2023 0941   ALBUMIN 4.1 02/19/2023 0941   AST 29 02/19/2023 0941   ALT 27 02/19/2023 0941   ALKPHOS 82 02/19/2023 0941   BILITOT 0.4 02/19/2023 0941      Component Value Date/Time   TSH 4.470 12/23/2022 1158   Nutritional Lab Results  Component Value Date   VD25OH 27.0 (L) 02/19/2023     Return in about 3 weeks (around 05/14/2023) for For  Weight Mangement with Dr. Rikki Spearing.Marland Kitchen She was informed of the importance of frequent follow up visits to maximize her success with intensive lifestyle modifications for her multiple health conditions.   ATTESTASTION STATEMENTS:  Reviewed by clinician on day of visit: allergies, medications, problem list, medical history, surgical history, family history, social history, and previous encounter notes.     Worthy Rancher, MD

## 2023-04-23 NOTE — Assessment & Plan Note (Signed)
Most recent vitamin D levels  Lab Results  Component Value Date   VD25OH 27.0 (L) 02/19/2023     Deficiency state associated with adiposity and may result in leptin resistance, weight gain and fatigue.  On high-dose vitamin D supplementation without any adverse effects  Plan: Continue supplementation until August check levels at that time and transition to over-the-counter supplementation if levels are replenished.

## 2023-04-23 NOTE — Assessment & Plan Note (Signed)
Patient reports suboptimal adherence to PAP therapy.  She cannot tolerate the mask.  She was counseled on the risk associated with untreated sleep apnea.  She is doing a good job with weight loss.  Losing 15% of body weight may reduce AHI.

## 2023-04-24 ENCOUNTER — Ambulatory Visit (INDEPENDENT_AMBULATORY_CARE_PROVIDER_SITE_OTHER): Payer: No Typology Code available for payment source | Admitting: Internal Medicine

## 2023-04-27 IMAGING — MG MM DIGITAL SCREENING BILAT W/ TOMO AND CAD
8 series · 8 of 24 positions shown · non-contrast
Comparison: Previous exam(s).

CLINICAL DATA: Screening.

EXAM:
DIGITAL SCREENING BILATERAL MAMMOGRAM WITH TOMOSYNTHESIS AND CAD
TECHNIQUE: Bilateral screening digital craniocaudal and mediolateral oblique
mammograms were obtained. Bilateral screening digital breast
tomosynthesis was performed. The images were evaluated with
computer-aided detection.

[R MLO synth-2D]
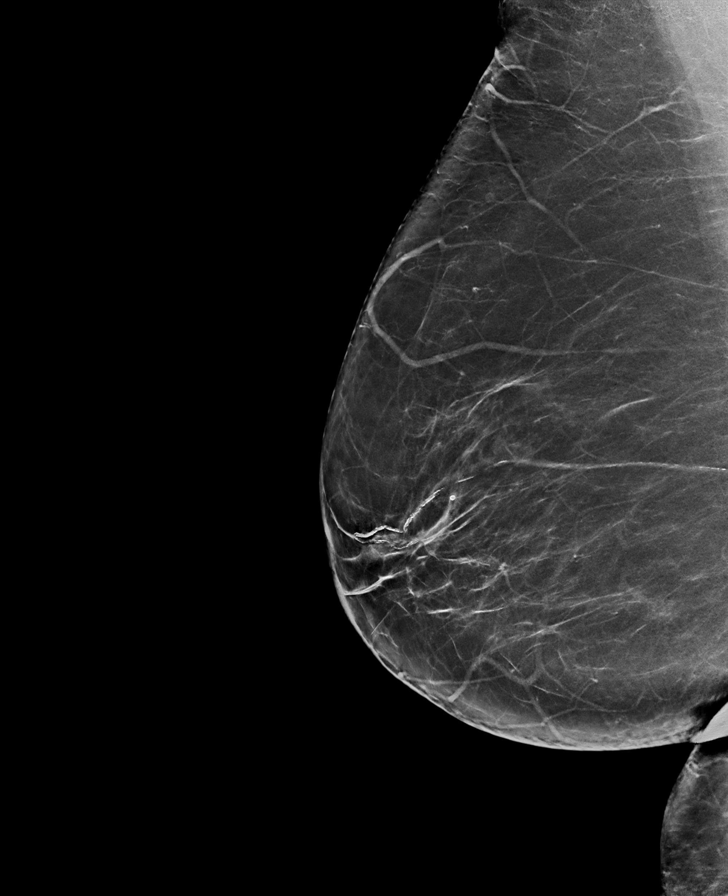

[L CC synth-2D]
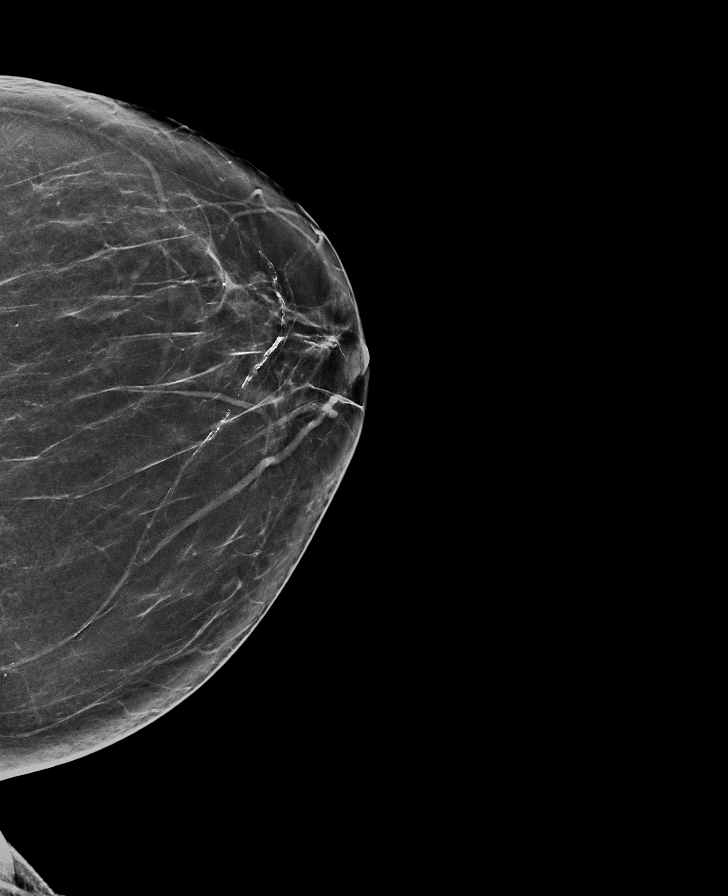

[R CC synth-2D]
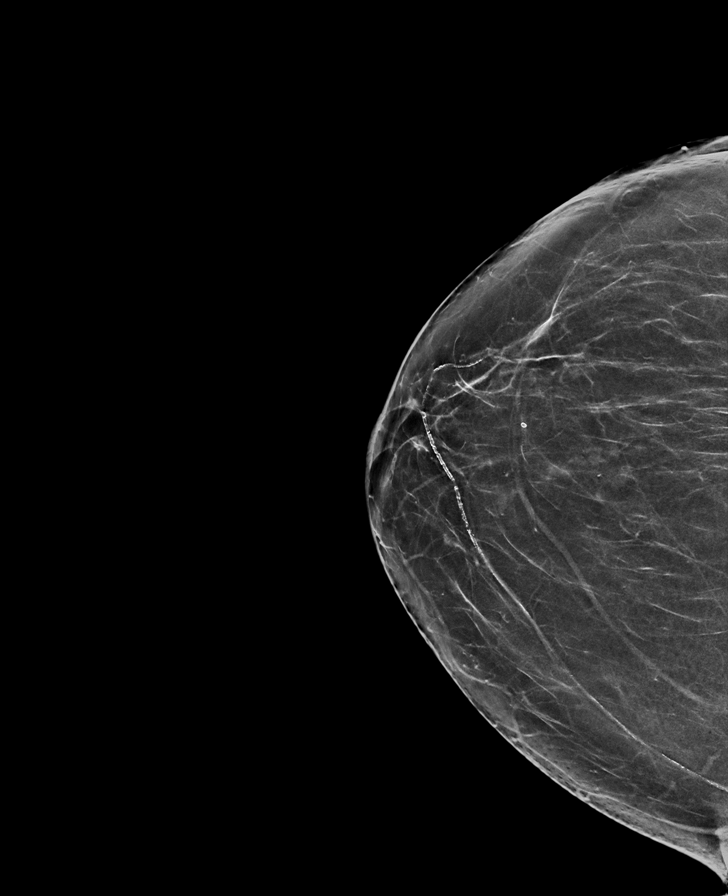

[L MLO synth-2D]
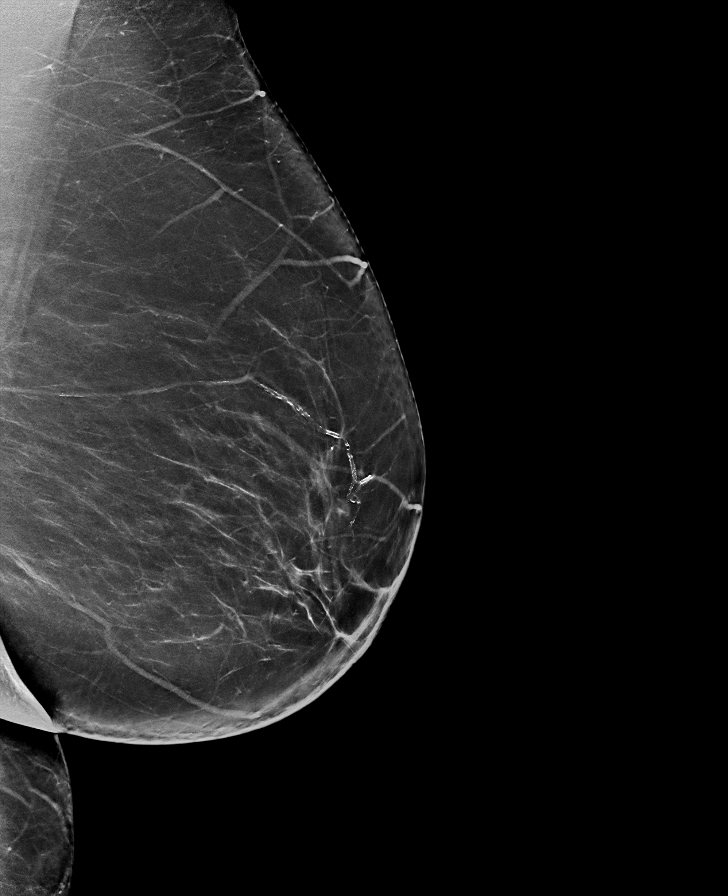

[L MLO tomo · tomo slice 42/83.0]
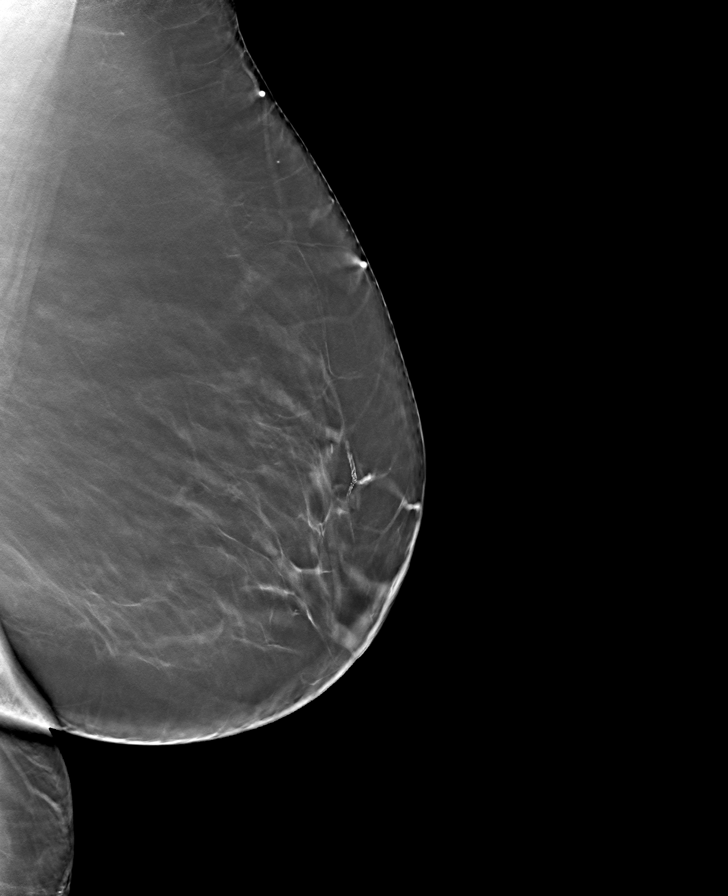

[R MLO tomo · tomo slice 39/77.0]
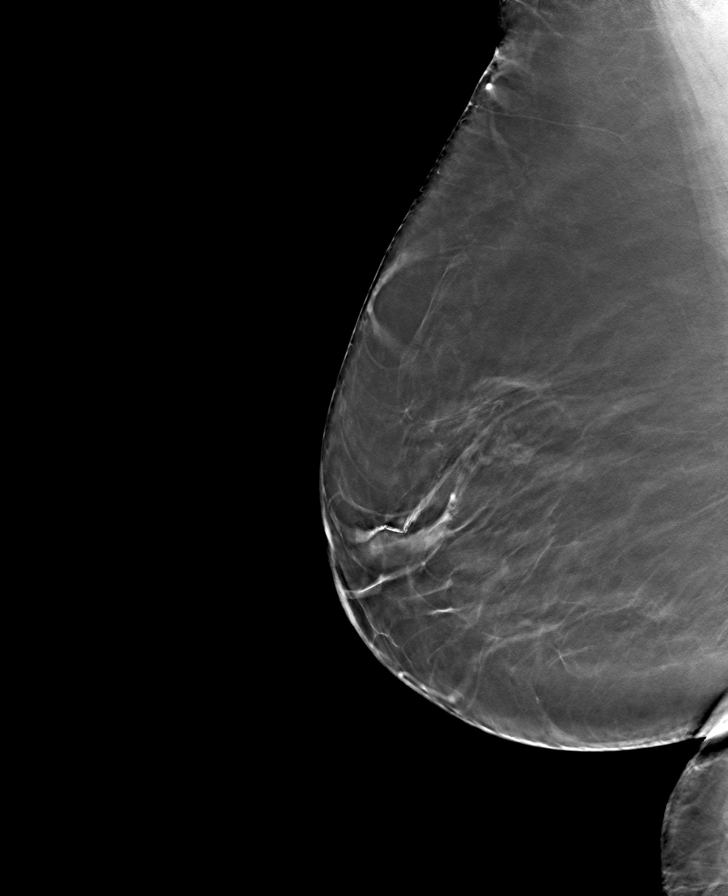

[R CC tomo · tomo slice 35/70.0]
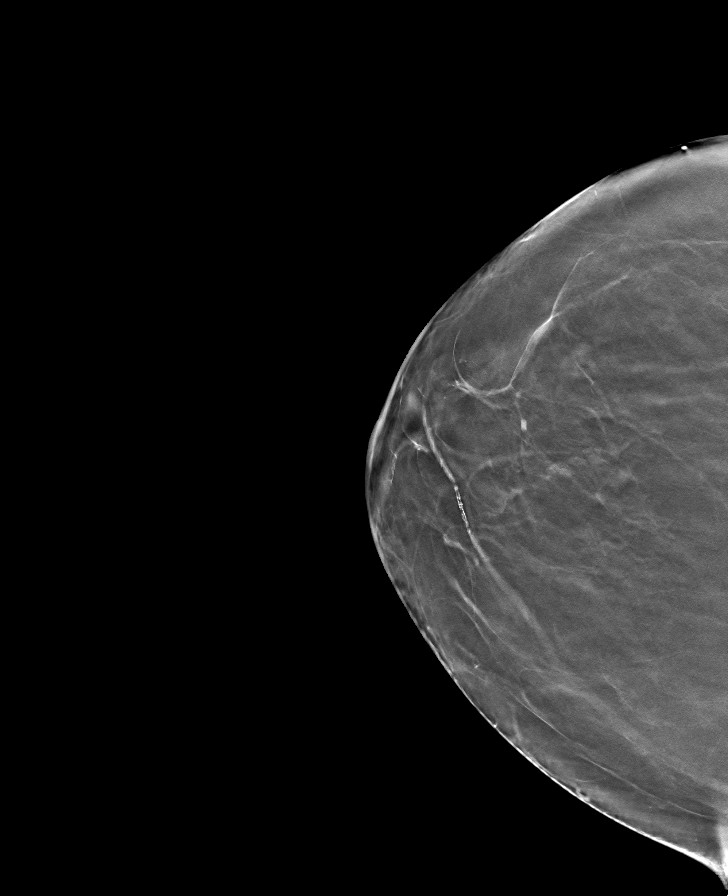

[L CC tomo · tomo slice 37/73.0]
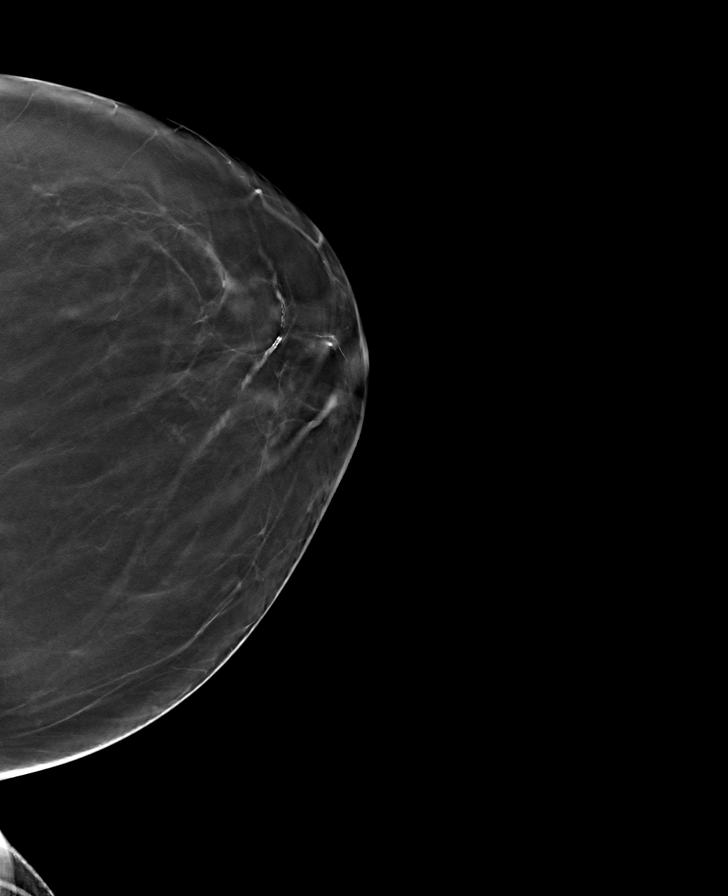

[8 of 24 positions shown; findings below may reference images not displayed]

ACR Breast Density Category b: There are scattered areas of
fibroglandular density.
FINDINGS: There are no findings suspicious for malignancy.
IMPRESSION: No mammographic evidence of malignancy. A result letter of this
screening mammogram will be mailed directly to the patient.

RECOMMENDATION:
Screening mammogram in one year. (Code:51-O-LD2)

BI-RADS CATEGORY  1: Negative.

## 2023-04-30 IMAGING — US US ABDOMEN LIMITED
1 series · 8 of 8 positions shown · non-contrast
Comparison: None.

CLINICAL DATA: lipoma; palpable painful mass

EXAM:
ULTRASOUND ABDOMEN LIMITED

[Series 1: us abdomen limited · 8 acquisitions, 8 frames shown]
[im 1/8]
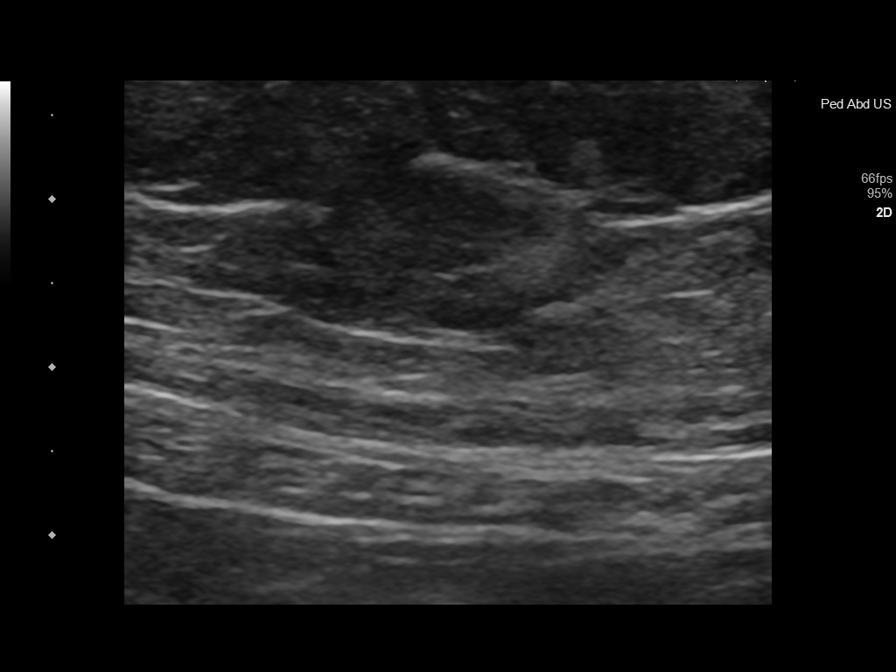
[im 2/8]
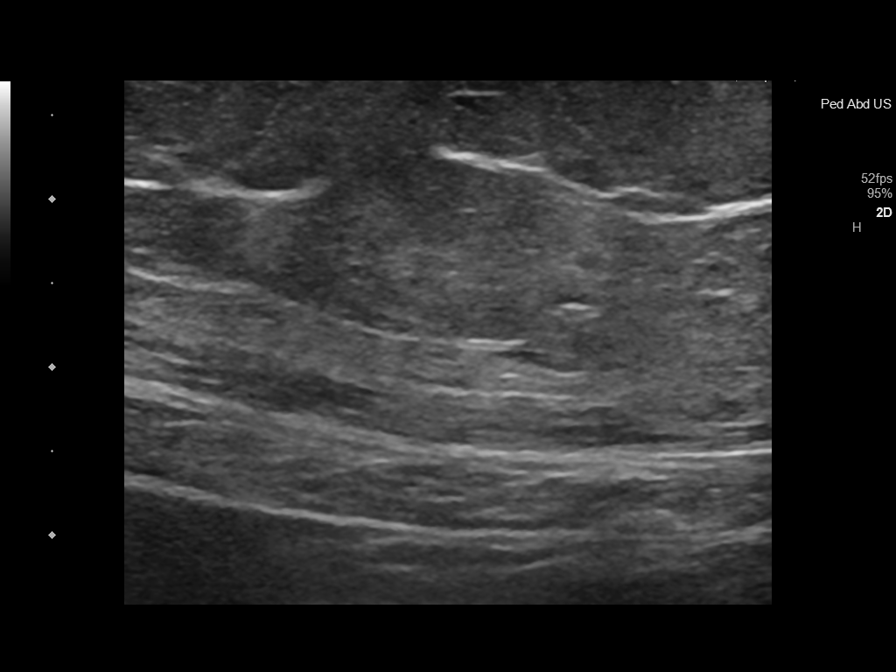
[im 3/8]
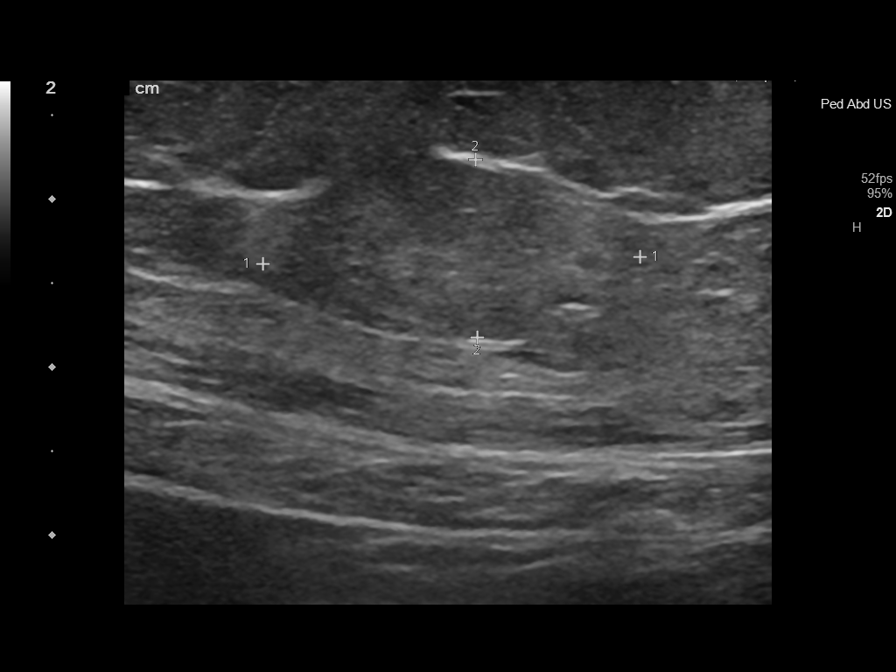
[im 4/8]
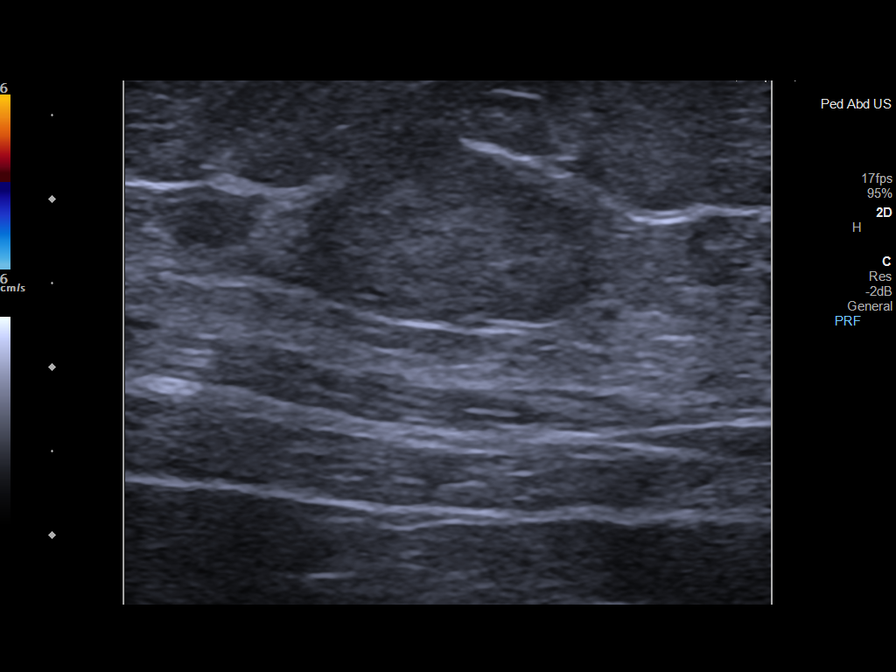
[im 5/8]
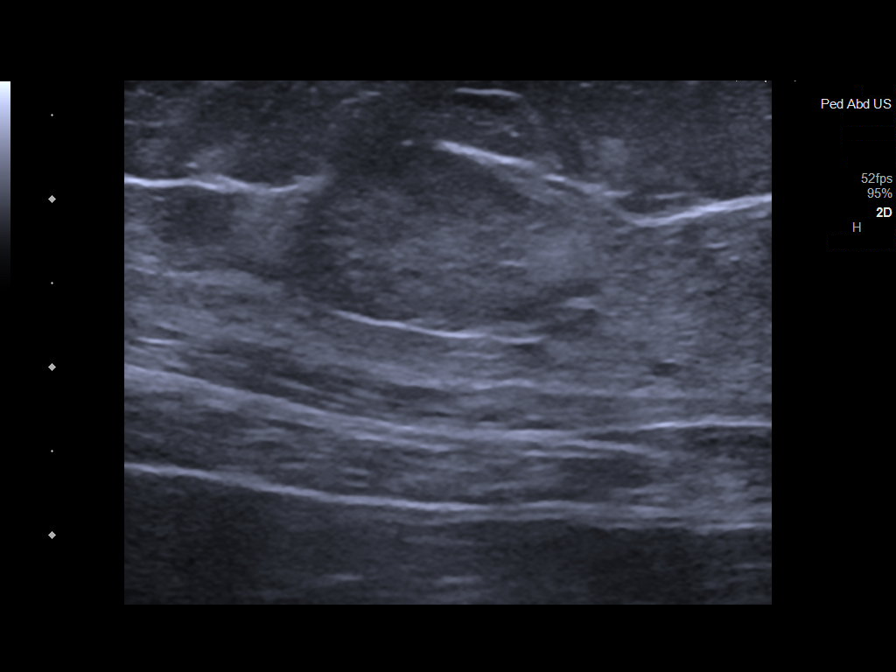
[im 6/8]
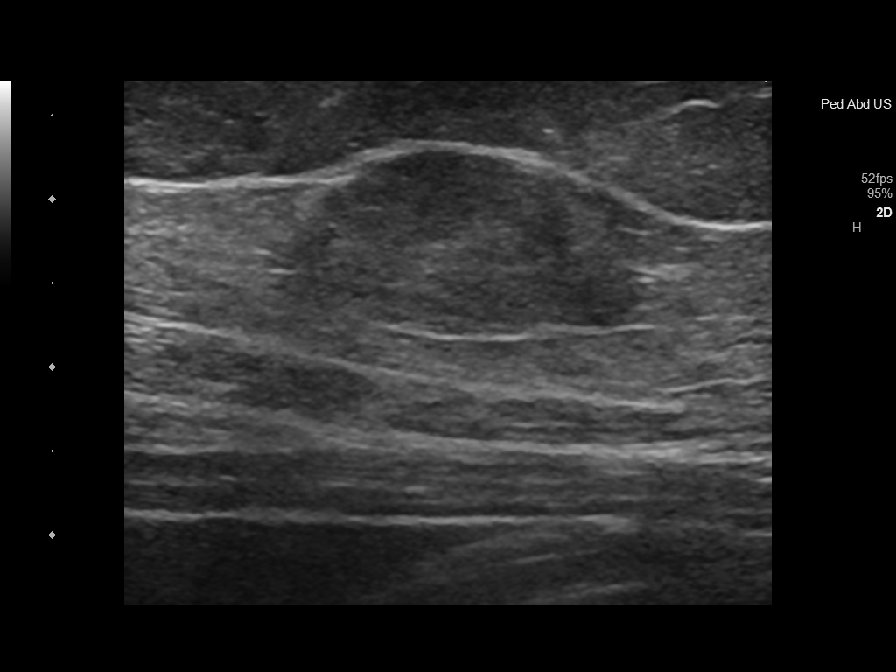
[im 7/8]
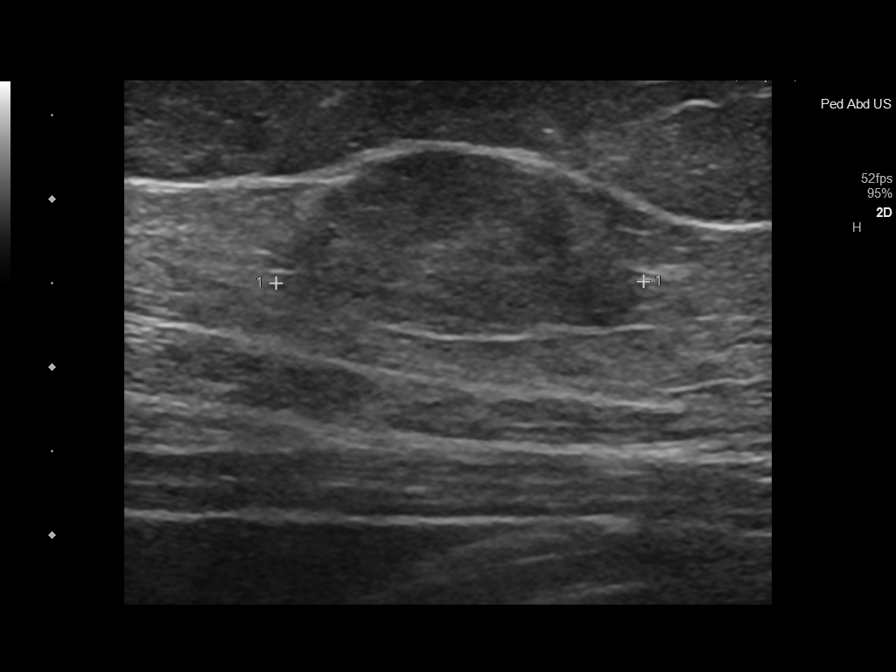
[im 8/8]
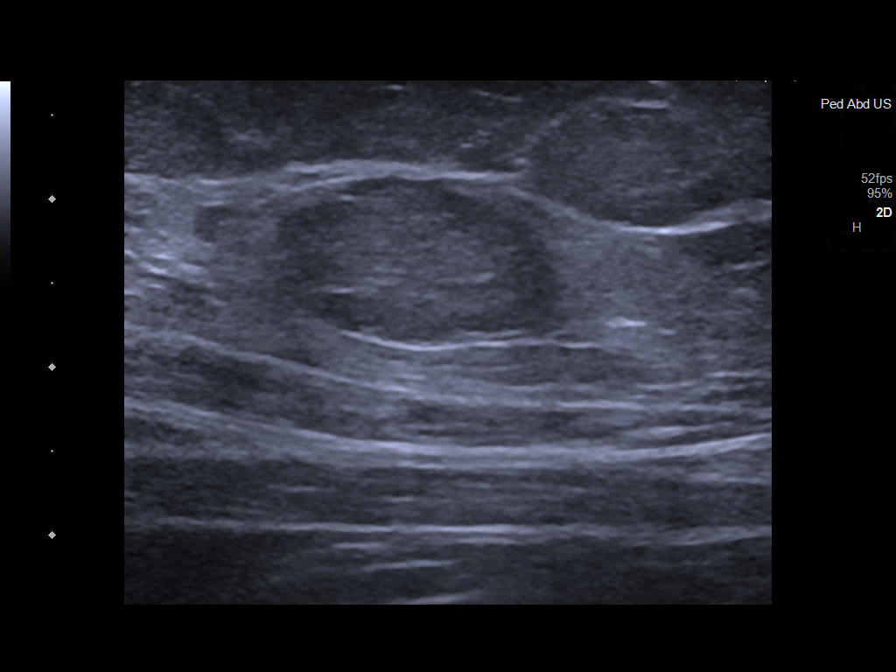

[8 of 8 positions shown; findings below may reference images not displayed]

FINDINGS: Targeted ultrasound was performed of the site of palpable concern in
the RIGHT flank. At the site of palpable concern, there is an oval
circumscribed isoechoic mass. It measures 2.3 x 1.1 x 2.2 cm. No
increased internal vascularity or heterogeneity.
IMPRESSION: There is a fat density 2.3 cm mass at the site of palpable concern
which may reflect a lipoma versus prominent fat lobule.

## 2023-05-06 ENCOUNTER — Ambulatory Visit: Payer: No Typology Code available for payment source | Admitting: Family Medicine

## 2023-05-06 ENCOUNTER — Telehealth: Payer: Self-pay

## 2023-05-06 DIAGNOSIS — Z880 Allergy status to penicillin: Secondary | ICD-10-CM | POA: Diagnosis not present

## 2023-05-06 DIAGNOSIS — E039 Hypothyroidism, unspecified: Secondary | ICD-10-CM | POA: Diagnosis not present

## 2023-05-06 DIAGNOSIS — F419 Anxiety disorder, unspecified: Secondary | ICD-10-CM | POA: Diagnosis not present

## 2023-05-06 DIAGNOSIS — R1032 Left lower quadrant pain: Secondary | ICD-10-CM | POA: Diagnosis not present

## 2023-05-06 DIAGNOSIS — F32A Depression, unspecified: Secondary | ICD-10-CM | POA: Diagnosis not present

## 2023-05-06 DIAGNOSIS — K5792 Diverticulitis of intestine, part unspecified, without perforation or abscess without bleeding: Secondary | ICD-10-CM | POA: Diagnosis not present

## 2023-05-06 DIAGNOSIS — Z7982 Long term (current) use of aspirin: Secondary | ICD-10-CM | POA: Diagnosis not present

## 2023-05-06 DIAGNOSIS — K5732 Diverticulitis of large intestine without perforation or abscess without bleeding: Secondary | ICD-10-CM | POA: Diagnosis not present

## 2023-05-06 DIAGNOSIS — Z885 Allergy status to narcotic agent status: Secondary | ICD-10-CM | POA: Diagnosis not present

## 2023-05-06 DIAGNOSIS — R112 Nausea with vomiting, unspecified: Secondary | ICD-10-CM | POA: Diagnosis not present

## 2023-05-06 DIAGNOSIS — Z87891 Personal history of nicotine dependence: Secondary | ICD-10-CM | POA: Diagnosis not present

## 2023-05-06 DIAGNOSIS — Z79890 Hormone replacement therapy (postmenopausal): Secondary | ICD-10-CM | POA: Diagnosis not present

## 2023-05-06 NOTE — Telephone Encounter (Signed)
Pt was contacted about her abdominal pain. She stated she has been hurting since Sunday. It gets worse when walking, can't lay on that side. Painful when pressing on that area. She also stated she has a history of diverticulitis. Pt was told we could see her, but given what she is describing, it sounds like she is going to need blood work and possible [place] or CT. She has decided to go to hospital for further eval.

## 2023-05-12 ENCOUNTER — Ambulatory Visit (INDEPENDENT_AMBULATORY_CARE_PROVIDER_SITE_OTHER): Payer: No Typology Code available for payment source | Admitting: Family Medicine

## 2023-05-12 ENCOUNTER — Encounter: Payer: Self-pay | Admitting: Family Medicine

## 2023-05-12 VITALS — BP 126/82 | HR 62 | Ht 60.0 in | Wt 202.0 lb

## 2023-05-12 DIAGNOSIS — K5792 Diverticulitis of intestine, part unspecified, without perforation or abscess without bleeding: Secondary | ICD-10-CM | POA: Diagnosis not present

## 2023-05-12 NOTE — Progress Notes (Unsigned)
Date:  05/12/2023   Name:  Nicole Robles   DOB:  1951/07/23   MRN:  [id number]   Chief Complaint: Hospitalization Follow-up (Patient went to hospital for diverticulitis in left side. Patient said she is a lot better.)  Patient is a 72 year old female who presents for a ER visit recheck exam. The patient reports the following problems: diverticulitis. Health maintenance has been reviewed up to date      Lab Results  Component Value Date   NA 142 02/19/2023   K 4.1 02/19/2023   CO2 24 02/19/2023   GLUCOSE 99 02/19/2023   BUN 11 02/19/2023   CREATININE 0.85 02/19/2023   CALCIUM 9.0 02/19/2023   EGFR 72 02/19/2023   GFRNONAA >60 03/03/2020   Lab Results  Component Value Date   CHOL 160 12/23/2022   HDL 53 12/23/2022   LDLCALC 78 12/23/2022   TRIG 172 (H) 12/23/2022   Lab Results  Component Value Date   TSH 4.470 12/23/2022   Lab Results  Component Value Date   HGBA1C 6.3 (H) 12/23/2022   Lab Results  Component Value Date   WBC 9.0 02/19/2023   HGB 12.7 02/19/2023   HCT 40.5 02/19/2023   MCV 89 02/19/2023   PLT 212 02/19/2023   Lab Results  Component Value Date   ALT 27 02/19/2023   AST 29 02/19/2023   ALKPHOS 82 02/19/2023   BILITOT 0.4 02/19/2023   Lab Results  Component Value Date   VD25OH 27.0 (L) 02/19/2023     Review of Systems  Constitutional:  Negative for chills and fever.  Respiratory:  Positive for chest tightness. Negative for shortness of breath.   Cardiovascular:  Negative for palpitations.  Gastrointestinal:  Negative for abdominal pain and blood in stool.    Patient Active Problem List   Diagnosis Date Noted   Vitamin D deficiency 03/05/2023   Physically inactive 03/05/2023   OSA (obstructive sleep apnea) 02/06/2023   Class 3 severe obesity with serious comorbidity and body mass index (BMI) of 40.0 to 44.9 in adult [facility]) 02/06/2023   Prediabetes 06/17/2022   Asthmatic bronchitis , chronic 12/19/2020   Hyperlipidemia, mixed  08/04/2018   Hypothyroidism 07/28/2018   Recurrent major depressive disorder, in partial remission (HCC) 07/28/2018    Allergies  Allergen Reactions   Augmentin [Amoxicillin-Pot Clavulanate] Hives   Darvon [Propoxyphene] Nausea And Vomiting   Demerol [Meperidine Hcl]     Past Surgical History:  Procedure Laterality Date   ABDOMINAL HYSTERECTOMY     CHOLECYSTECTOMY     heart ablation      Social History   Tobacco Use   Smoking status: Former    Current packs/day: 0.00    Average packs/day: 1.5 packs/day for 20.0 years (30.0 ttl pk-yrs)    Types: Cigarettes    Start date: 10/07/1976    Quit date: 10/07/1996    Years since quitting: 26.6   Smokeless tobacco: Never  Vaping Use   Vaping status: Never Used  Substance Use Topics   Alcohol use: Never   Drug use: Never     Medication list has been reviewed and updated.  Current Meds  Medication Sig   albuterol (VENTOLIN HFA) 108 (90 Base) MCG/ACT inhaler INHALE 2 TO 4 PUFFS BY MOUTH EVERY 4 HOURS AS NEEDED FOR WHEEZING OR COUGH OR SHORTNESS OF BREATH   aspirin EC 81 MG tablet Take 81 mg by mouth daily.   levothyroxine (SYNTHROID) 88 MCG tablet Take 1 tablet (88 mcg  total) by mouth daily before breakfast.   metFORMIN (GLUCOPHAGE-XR) 500 MG 24 hr tablet Take 1 tablet (500 mg total) by mouth 2 (two) times daily with a meal.   metoprolol tartrate (LOPRESSOR) 50 MG tablet Take 50 mg by mouth daily. cardio   Multiple Vitamin (MULTIVITAMIN) capsule Take 1 capsule by mouth daily. Hair and skin   omeprazole (PRILOSEC) 20 MG capsule Take 20 mg by mouth 2 (two) times daily before a meal. GI   pravastatin (PRAVACHOL) 10 MG tablet Take 1 tablet (10 mg total) by mouth daily.   sertraline (ZOLOFT) 25 MG tablet Take 1 tablet (25 mg total) by mouth daily.   traZODone (DESYREL) 50 MG tablet Take 1 tablet (50 mg total) by mouth at bedtime.   Vitamin D, Ergocalciferol, (DRISDOL) 1.25 MG (50000 UNIT) CAPS capsule Take 1 capsule (50,000 Units total)  by mouth every 7 (seven) days.       05/12/2023    3:26 PM 12/23/2022   10:57 AM 11/29/2022    1:26 PM  GAD 7 : Generalized Anxiety Score  Nervous, Anxious, on Edge 0 0 0  Control/stop worrying 0 0 0  Worry too much - different things 0 0 0  Trouble relaxing 0 0 0  Restless 0 0 0  Easily annoyed or irritable 0 0 0  Afraid - awful might happen 0 0 0  Total GAD 7 Score 0 0 0  Anxiety Difficulty Not difficult at all Not difficult at all Not difficult at all       05/12/2023    3:26 PM 12/23/2022   10:57 AM 11/29/2022    1:25 PM  Depression screen PHQ 2/9  Decreased Interest 0 0 0  Down, Depressed, Hopeless 0 0 0  PHQ - 2 Score 0 0 0  Altered sleeping 0 0 0  Tired, decreased energy 0 0 1  Change in appetite 0 0 0  Feeling bad or failure about yourself  0 0 0  Trouble concentrating 0 0 0  Moving slowly or fidgety/restless 0 0 0  Suicidal thoughts 0 0 0  PHQ-9 Score 0 0 1  Difficult doing work/chores Not difficult at all Not difficult at all Not difficult at all    BP Readings from Last 3 Encounters:  05/12/23 126/82  04/23/23 133/65  03/26/23 128/79    Physical Exam Vitals and nursing note reviewed.  HENT:     Right Ear: Tympanic membrane normal.     Left Ear: Tympanic membrane normal.     Nose: No congestion or rhinorrhea.     Mouth/Throat:     Mouth: Mucous membranes are moist.  Cardiovascular:     Rate and Rhythm: Normal rate.     Heart sounds: No murmur heard.    No friction rub. No gallop.  Pulmonary:     Breath sounds: No wheezing, rhonchi or rales.  Abdominal:     Tenderness: There is no abdominal tenderness. There is no guarding or rebound.  Musculoskeletal:     Cervical back: Normal range of motion.     Wt Readings from Last 3 Encounters:  05/12/23 202 lb (91.6 kg)  04/23/23 199 lb (90.3 kg)  03/26/23 207 lb (93.9 kg)    BP 126/82   Pulse 62   Ht 5' (1.524 m)   Wt 202 lb (91.6 kg)   SpO2 96%   BMI 39.45 kg/m   Assessment and Plan: 1.  Diverticulitis Follow-up emergency room visit for flareup of diverticulitis.  New  onset.  Last episode 25 years ago.  Controlled now.  Stable now.  Patient was evaluated last week for acute onset of left lower quadrant abdominal pain.  Examination and evaluation with CT was consistent with diverticular disease.  But because of the extensive swelling due to the inflamed colon suggestion of colonoscopy following complete resolution of acute symptoms is recommended and this is to exclude any underlying neoplasm.  Patient was notified of this and will we will make the appointment in the morning and return a call as to this being done this week to the patient.    Elizabeth Sauer, MD

## 2023-05-13 ENCOUNTER — Encounter: Payer: Self-pay | Admitting: Family Medicine

## 2023-05-14 ENCOUNTER — Telehealth: Payer: Self-pay | Admitting: Family Medicine

## 2023-05-14 NOTE — Telephone Encounter (Signed)
Copied from CRM [id number]. Topic: General - Inquiry >> May 14, 2023  1:48 PM Lennox Pippins wrote: Patient called and stated she saw Dr Yetta Barre on Monday 05/12/2023 and Dr Yetta Barre stated she needed to see an intestinal Doctor and said if a nurse does not call her back to give the office a call, please advise. Patient has not heard anything back.   Patients callback @ [phone]

## 2023-05-15 ENCOUNTER — Encounter: Payer: Self-pay | Admitting: Gastroenterology

## 2023-05-15 ENCOUNTER — Telehealth: Payer: Self-pay

## 2023-05-15 ENCOUNTER — Ambulatory Visit: Payer: No Typology Code available for payment source | Admitting: Gastroenterology

## 2023-05-15 ENCOUNTER — Other Ambulatory Visit: Payer: Self-pay

## 2023-05-15 VITALS — BP 134/65 | HR 64 | Temp 98.0°F | Ht 60.0 in | Wt 199.0 lb

## 2023-05-15 DIAGNOSIS — K5732 Diverticulitis of large intestine without perforation or abscess without bleeding: Secondary | ICD-10-CM | POA: Diagnosis not present

## 2023-05-15 DIAGNOSIS — R933 Abnormal findings on diagnostic imaging of other parts of digestive tract: Secondary | ICD-10-CM

## 2023-05-15 DIAGNOSIS — Z86010 Personal history of colonic polyps: Secondary | ICD-10-CM | POA: Diagnosis not present

## 2023-05-15 DIAGNOSIS — K5792 Diverticulitis of intestine, part unspecified, without perforation or abscess without bleeding: Secondary | ICD-10-CM

## 2023-05-15 DIAGNOSIS — R935 Abnormal findings on diagnostic imaging of other abdominal regions, including retroperitoneum: Secondary | ICD-10-CM

## 2023-05-15 MED ORDER — NA SULFATE-K SULFATE-MG SULF 17.5-3.13-1.6 GM/177ML PO SOLN: 1.0000 | Freq: Once | ORAL | 0 refills | Status: AC

## 2023-05-15 NOTE — Progress Notes (Signed)
Gastroenterology Consultation  Referring Provider:     Duanne Limerick, MD Primary Care Physician:  Nicole Limerick, MD Primary Gastroenterologist:  Dr. Servando Snare     Reason for Consultation:     Diverticulitis        HPI:   Nicole Robles is a 72 y.o. y/o female referred for consultation & management of diverticulitis by Dr. Duanne Limerick, MD. this patient comes in today after contacting her primary care provider with abdominal pain and was told to go to be evaluated at [facility] ER.  The patient had a CT scan at that time that showed her uncomplicated diverticulitis and was recommended the patient have a colonoscopy after the diverticulitis has resolved. The patient states that she was started on antibiotics that is now pain-free without any further symptoms of Dr. Malon Kindle.  She also reports that she had a similar episode 25 years ago but has not had a colonoscopy in 15 years. The patient had seen Dr. Norma Robles back in 2021 and at that time was recommended for colonoscopy but never went through with that.  She was now sent to me for a same-day urgent appointment for the recent diagnosis.  The patient denies any unexplained weight loss fevers chills nausea vomiting black stools or bloody stools.  Past Medical History:  Diagnosis Date   Back pain    Constipation    COPD (chronic obstructive pulmonary disease) (HCC)    Depression    GERD (gastroesophageal reflux disease)    High cholesterol    Hx of myocardial infarction    Joint pain    Palpitations    Prediabetes    Sleep apnea    SVT (supraventricular tachycardia)    Thyroid disease    Vitamin D deficiency     Past Surgical History:  Procedure Laterality Date   ABDOMINAL HYSTERECTOMY     CHOLECYSTECTOMY     heart ablation      Prior to Admission medications   Medication Sig Start Date End Date Taking? Authorizing Provider  albuterol (VENTOLIN HFA) 108 (90 Base) MCG/ACT inhaler INHALE 2 TO 4 PUFFS BY MOUTH EVERY 4 HOURS AS NEEDED  FOR WHEEZING OR COUGH OR SHORTNESS OF BREATH 04/04/23  Yes Nicole Limerick, MD  aspirin EC 81 MG tablet Take 81 mg by mouth daily.   Yes [provider]  levothyroxine (SYNTHROID) 88 MCG tablet Take 1 tablet (88 mcg total) by mouth daily before breakfast. 12/23/22  Yes Nicole Limerick, MD  metFORMIN (GLUCOPHAGE-XR) 500 MG 24 hr tablet Take 1 tablet (500 mg total) by mouth 2 (two) times daily with a meal. 04/23/23  Yes Nicole Rancher, MD  metoprolol tartrate (LOPRESSOR) 50 MG tablet Take 50 mg by mouth daily. cardio   Yes [provider]  Multiple Vitamin (MULTIVITAMIN) capsule Take 1 capsule by mouth daily. Hair and skin   Yes [provider]  omeprazole (PRILOSEC) 20 MG capsule Take 20 mg by mouth 2 (two) times daily before a meal. GI   Yes [provider]  pravastatin (PRAVACHOL) 10 MG tablet Take 1 tablet (10 mg total) by mouth daily. 12/23/22  Yes Nicole Limerick, MD  sertraline (ZOLOFT) 25 MG tablet Take 1 tablet (25 mg total) by mouth daily. 12/23/22  Yes Nicole Limerick, MD  traZODone (DESYREL) 50 MG tablet Take 1 tablet (50 mg total) by mouth at bedtime. 12/23/22  Yes Nicole Limerick, MD  Vitamin D, Ergocalciferol, (DRISDOL) 1.25 MG (50000 UNIT) CAPS  capsule Take 1 capsule (50,000 Units total) by mouth every 7 (seven) days. 03/05/23  Yes Nicole Rancher, MD  SUMAtriptan (IMITREX) 100 MG tablet Take 1 tablet (100 mg total) by mouth once for 1 dose. Repeat in 2 hrs x 1 if cont headache 11/29/22 02/19/23  Nicole Limerick, MD    Family History  Problem Relation Age of Onset   Heart disease Mother    Thyroid disease Mother    Diabetes Father    Heart disease Brother    Cancer Maternal Grandmother    Diabetes Daughter    Breast cancer Maternal Aunt    Breast cancer Cousin        mat cousin     Social History   Tobacco Use   Smoking status: Former    Current packs/day: 0.00    Average packs/day: 1.5 packs/day for 20.0 years (30.0 ttl pk-yrs)     Types: Cigarettes    Start date: 10/07/1976    Quit date: 10/07/1996    Years since quitting: 26.6   Smokeless tobacco: Never  Vaping Use   Vaping status: Never Used  Substance Use Topics   Alcohol use: Never   Drug use: Never    Allergies as of 05/15/2023 - Review Complete 05/15/2023  Allergen Reaction Noted   Augmentin [amoxicillin-pot clavulanate] Hives 01/24/2019   Darvon [propoxyphene] Nausea And Vomiting 01/24/2019   Demerol [meperidine hcl]  11/29/2022    Review of Systems:    All systems reviewed and negative except where noted in HPI.   Physical Exam:  BP 134/65 (BP Location: Left Arm, Patient Position: Sitting, Cuff Size: Large)   Pulse 64   Temp 98 F (36.7 C) (Oral)   Ht 5' (1.524 m)   Wt 199 lb (90.3 kg)   BMI 38.86 kg/m  No LMP recorded. Patient has had a hysterectomy. General:   Alert,  Well-developed, well-nourished, pleasant and cooperative in NAD Head:  Normocephalic and atraumatic. Eyes:  Sclera clear, no icterus.   Conjunctiva pink. Ears:  Normal auditory acuity. Neck:  Supple; no masses or thyromegaly. Lungs:  Respirations even and unlabored.  Clear throughout to auscultation.   No wheezes, crackles, or rhonchi. No acute distress. Heart:  Regular rate and rhythm; no murmurs, clicks, rubs, or gallops. Abdomen:  Normal bowel sounds.  No bruits.  Soft, non-tender and non-distended without masses, hepatosplenomegaly or hernias noted.  No guarding or rebound tenderness.  Negative Carnett sign.   Rectal:  Deferred.  Pulses:  Normal pulses noted. Extremities:  No clubbing or edema.  No cyanosis. Neurologic:  Alert and oriented x3;  grossly normal neurologically. Skin:  Intact without significant lesions or rashes.  No jaundice. Lymph Nodes:  No significant cervical adenopathy. Psych:  Alert and cooperative. Normal mood and affect.  Imaging Studies: No results found.  Assessment and Plan:   Nicole Robles is a 72 y.o. y/o female comes in today after  being seen in [facility] for diverticulitis and started on antibiotics.  The patient is totally asymptomatic at this time.  The patient has been recommended to undergo a colonoscopy due to her most recent colonoscopy 15 years ago and her recent bout of diverticulitis.  The patient has been told that we should wait 4 to 6 weeks prior to setting her up for colonoscopy due to her recent infection.  The patient has been explained the plan and agrees with it.  She will contact me if she has any further issues.    Midge Minium, MD.  FACG    Note: This dictation was prepared with Dragon dictation along with smaller phrase technology. Any transcriptional errors that result from this process are unintentional.

## 2023-05-15 NOTE — Progress Notes (Signed)
Ref placed to GI 

## 2023-05-15 NOTE — Telephone Encounter (Signed)
Procedure clearance faxed to [facility] cardiology

## 2023-05-16 NOTE — Telephone Encounter (Signed)
Received fax stating that pt is cleared for procedure  Form sent to be scanned

## 2023-05-20 ENCOUNTER — Ambulatory Visit: Payer: Self-pay | Admitting: Neurology

## 2023-05-21 ENCOUNTER — Encounter (INDEPENDENT_AMBULATORY_CARE_PROVIDER_SITE_OTHER): Payer: Self-pay | Admitting: Internal Medicine

## 2023-05-21 ENCOUNTER — Other Ambulatory Visit (INDEPENDENT_AMBULATORY_CARE_PROVIDER_SITE_OTHER): Payer: Self-pay | Admitting: Internal Medicine

## 2023-05-21 ENCOUNTER — Ambulatory Visit (INDEPENDENT_AMBULATORY_CARE_PROVIDER_SITE_OTHER): Payer: No Typology Code available for payment source | Admitting: Internal Medicine

## 2023-05-21 VITALS — BP 136/84 | HR 59 | Temp 98.1°F | Ht 60.0 in | Wt 196.0 lb

## 2023-05-21 DIAGNOSIS — Z6838 Body mass index (BMI) 38.0-38.9, adult: Secondary | ICD-10-CM | POA: Diagnosis not present

## 2023-05-21 DIAGNOSIS — K760 Fatty (change of) liver, not elsewhere classified: Secondary | ICD-10-CM

## 2023-05-21 DIAGNOSIS — E559 Vitamin D deficiency, unspecified: Secondary | ICD-10-CM

## 2023-05-21 DIAGNOSIS — R7303 Prediabetes: Secondary | ICD-10-CM | POA: Diagnosis not present

## 2023-05-21 DIAGNOSIS — K5792 Diverticulitis of intestine, part unspecified, without perforation or abscess without bleeding: Secondary | ICD-10-CM | POA: Diagnosis not present

## 2023-05-21 MED ORDER — VITAMIN D (ERGOCALCIFEROL) 1.25 MG (50000 UNIT) PO CAPS
50000.0000 [IU] | ORAL_CAPSULE | ORAL | 0 refills | Status: DC
Start: 2023-05-21 — End: 2023-07-21

## 2023-05-21 MED ORDER — METFORMIN HCL ER 500 MG PO TB24
500.0000 mg | ORAL_TABLET | Freq: Two times a day (BID) | ORAL | 0 refills | Status: DC
Start: 2023-05-21 — End: 2023-05-21

## 2023-05-21 NOTE — Assessment & Plan Note (Addendum)
I reviewed recent CT scan done at [facility] from July.  It showed uncomplicated diverticulitis in the left descending colon and also hepatic steatosis.  She has normal liver enzymes.  Losing 15% of body weight may improve condition.  She continues to work on reducing simple and added sugars in her diet.  She does not drink alcohol or take over-the-counter supplements.

## 2023-05-21 NOTE — Assessment & Plan Note (Addendum)
BIA suggests a reduction in muscle mass, may be artefactual (time of day).  Counseled patient on increasing protein intake.  She has lost close to 10% of body weight if we see she continues to lose muscle we will increase calorie target to reduce rate of weight loss.

## 2023-05-21 NOTE — Progress Notes (Signed)
Office: [phone]  /  Fax: [phone]  WEIGHT SUMMARY AND BIOMETRICS  Vitals Temp: 98.1 F (36.7 C) BP: 136/84 Pulse Rate: (!) 59 SpO2: 97 %   Anthropometric Measurements Height: 5' (1.524 m) Weight: 196 lb (88.9 kg) BMI (Calculated): 38.28 Weight at Last Visit: 199 lb Weight Lost Since Last Visit: 3 lb Weight Gained Since Last Visit: 0 lb Starting Weight: 215 lb Total Weight Loss (lbs): 19 lb (8.618 kg)   Body Composition  Body Fat %: 50.3 % Fat Mass (lbs): 98.8 lbs Muscle Mass (lbs): 92.8 lbs Total Body Water (lbs): 68.8 lbs Visceral Fat Rating : 17    No data recorded Today's Visit #: 5  Starting Date: 02/19/23   HPI  Chief Complaint: OBESITY  Nicole Robles is here to discuss her progress with her obesity treatment plan. She is on the the Category 1 Plan and states she is following her eating plan approximately 60 % of the time. She states she is walking more, trying to get 6,000 steps 7 times per week.  Interval History:  Since last office visit she has lost 3 lbs. Has a recent spell of diverticulitis. Has noticed changes in clothes size.  She reports good adherence to reduced calorie nutritional plan. She has been working on not skipping meals, increasing protein intake at every meal, and drinking more water  Orixegenic Control: Denies problems with appetite and hunger signals.  Denies problems with satiety and satiation.  Denies problems with eating patterns and portion control.  Denies abnormal cravings. Denies feeling deprived or restricted.   Barriers identified:  recent diverticulitis .   Pharmacotherapy for weight loss: She is currently taking Metformin (off label use for incretin effect and / or insulin resistance and / or diabetes prevention) with adequate clinical response  and without side effects..    ASSESSMENT AND PLAN  TREATMENT PLAN FOR OBESITY:  Recommended Dietary Goals  Nicole Robles is currently in the action stage of change. As  such, her goal is to continue weight management plan. She has agreed to: continue current plan  Behavioral Intervention  We discussed the following Behavioral Modification Strategies today: increasing lean protein intake, decreasing simple carbohydrates , increasing vegetables, increasing lower glycemic fruits, increasing water intake, continue to practice mindfulness when eating, and planning for success.  Additional resources provided today: None  Recommended Physical Activity Goals  Nicole Robles has been advised to work up to 150 minutes of moderate intensity aerobic activity a week and strengthening exercises 2-3 times per week for cardiovascular health, weight loss maintenance and preservation of muscle mass.   She has agreed to :  Resume PA when diverticulitis improves.  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  Counseled patient on increasing protein intake.  She has lost close to 10% of body weight if we see she continues to lose muscle we will increase calorie target to reduce rate of weight loss.   Vitamin D deficiency Assessment & Plan: Most recent vitamin D levels  Lab Results  Component Value Date   VD25OH 27.0 (L) 02/19/2023     Deficiency state associated with adiposity and may result in leptin resistance,  weight gain and fatigue.  On high-dose vitamin D supplementation without any adverse effects  Plan: Continue supplementation until August check levels at that time and transition to over-the-counter supplementation if levels are replenished.  Orders: -     Vitamin D  (Ergocalciferol); Take 1 capsule (50,000 Units total) by mouth every 7 (seven) days.  Dispense: 13 capsule; Refill: 0  Prediabetes Assessment & Plan: Most recent A1c is  Lab Results  Component Value Date   HGBA1C 6.3 (H) 12/23/2022    Patient aware of disease state and risk of progression. This may contribute to abnormal cravings, fatigue and diabetic complications without having diabetes.   Currently on metformin for pharmacoprophylaxis without side effects.  Will continue metformin XR 500 mg twice a day.  Orders: -     metFORMIN HCl ER; Take 1 tablet (500 mg total) by mouth 2 (two) times daily with a meal.  Dispense: 60 tablet; Refill: 0  Hepatic steatosis Assessment & Plan: I reviewed recent CT scan done at [facility] from July.  It showed uncomplicated diverticulitis in the left descending colon and also hepatic steatosis.  She has normal liver enzymes.  Losing 15% of body weight may improve condition.  She continues to work on reducing simple and added sugars in her diet.  She does not drink alcohol or take over-the-counter supplements.     Diverticulitis Assessment & Plan: Reviewed records from [facility] she was seen in the emergency department for acute diverticulitis.  She was prescribed metronidazole and ciprofloxacin for 5 days with improvement in symptoms.  She is now scheduled to have a colonoscopy to follow-up on imaging findings.  Patient counseled on monitoring for signs of complication.  She was advised to hold metformin while completing bowel prep.     PHYSICAL EXAM:  Blood pressure 136/84, pulse (!) 59, temperature 98.1 F (36.7 C), height 5' (1.524 m), weight 196 lb (88.9 kg), SpO2 97%. Body mass index is 38.28 kg/m.  General: She is overweight, cooperative, alert, well developed, and in no acute distress. PSYCH: Has normal mood, affect and thought process.   HEENT: EOMI, sclerae are anicteric. Lungs: Normal breathing effort, no conversational dyspnea. Extremities: No  edema.  Neurologic: No gross sensory or motor deficits. No tremors or fasciculations noted.    DIAGNOSTIC DATA REVIEWED:  BMET    Component Value Date/Time   NA 142 02/19/2023 0941   K 4.1 02/19/2023 0941   CL 101 02/19/2023 0941   CO2 24 02/19/2023 0941   GLUCOSE 99 02/19/2023 0941   GLUCOSE 107 (H) 03/03/2020 0407   BUN 11 02/19/2023 0941   CREATININE 0.85 02/19/2023 0941   CALCIUM 9.0 02/19/2023 0941   GFRNONAA >60 03/03/2020 0407   GFRAA >60 03/03/2020 0407   Lab Results  Component Value Date   HGBA1C 6.3 (H) 12/23/2022   Lab Results  Component Value Date   INSULIN 23.8 02/19/2023   Lab Results  Component Value Date   TSH 4.470 12/23/2022   CBC    Component Value Date/Time   WBC 9.0 02/19/2023 0941   WBC 8.3 03/03/2020 0407   RBC 4.54 02/19/2023 0941   RBC 4.45 03/03/2020 0407   HGB 12.7 02/19/2023 0941   HCT 40.5 02/19/2023 0941   PLT 212 02/19/2023 0941   MCV 89 02/19/2023 0941   MCH 28.0 02/19/2023 0941   MCH 28.3 03/03/2020 0407   MCHC 31.4 (L) 02/19/2023 0941   MCHC 32.1 03/03/2020 0407   RDW 12.9 02/19/2023 0941   Iron  Studies No results found for: "IRON", "TIBC", "FERRITIN", "IRONPCTSAT" Lipid Panel     Component Value Date/Time   CHOL 160 12/23/2022 1158   TRIG 172 (H) 12/23/2022 1158   HDL 53 12/23/2022 1158   LDLCALC 78 12/23/2022 1158   Hepatic Function Panel     Component Value Date/Time   PROT 6.7 02/19/2023 0941   ALBUMIN 4.1 02/19/2023 0941   AST 29 02/19/2023 0941   ALT 27 02/19/2023 0941   ALKPHOS 82 02/19/2023 0941   BILITOT 0.4 02/19/2023 0941      Component Value Date/Time   TSH 4.470 12/23/2022 1158   Nutritional Lab Results  Component Value Date   VD25OH 27.0 (L) 02/19/2023     Return in about 3 weeks (around 06/11/2023) for For Weight Mangement with Dr. Rikki Spearing.Marland Kitchen She was informed of the importance of frequent follow up visits to maximize her success with intensive lifestyle modifications for her multiple health  conditions.   ATTESTASTION STATEMENTS:  Reviewed by clinician on day of visit: allergies, medications, problem list, medical history, surgical history, family history, social history, and previous encounter notes.     Worthy Rancher, MD

## 2023-05-21 NOTE — Assessment & Plan Note (Signed)
Reviewed records from [facility] she was seen in the emergency department for acute diverticulitis.  She was prescribed metronidazole and ciprofloxacin for 5 days with improvement in symptoms.  She is now scheduled to have a colonoscopy to follow-up on imaging findings.  Patient counseled on monitoring for signs of complication.  She was advised to hold metformin while completing bowel prep.

## 2023-05-21 NOTE — Assessment & Plan Note (Signed)
Most recent vitamin D levels  Lab Results  Component Value Date   VD25OH 27.0 (L) 02/19/2023     Deficiency state associated with adiposity and may result in leptin resistance, weight gain and fatigue.  On high-dose vitamin D supplementation without any adverse effects  Plan: Continue supplementation until August check levels at that time and transition to over-the-counter supplementation if levels are replenished.

## 2023-05-21 NOTE — Assessment & Plan Note (Signed)
Most recent A1c is  Lab Results  Component Value Date   HGBA1C 6.3 (H) 12/23/2022    Patient aware of disease state and risk of progression. This may contribute to abnormal cravings, fatigue and diabetic complications without having diabetes.   Currently on metformin for pharmacoprophylaxis without side effects.  Will continue metformin XR 500 mg twice a day.

## 2023-06-16 ENCOUNTER — Ambulatory Visit (INDEPENDENT_AMBULATORY_CARE_PROVIDER_SITE_OTHER): Payer: No Typology Code available for payment source | Admitting: Internal Medicine

## 2023-06-16 ENCOUNTER — Encounter (INDEPENDENT_AMBULATORY_CARE_PROVIDER_SITE_OTHER): Payer: Self-pay | Admitting: Internal Medicine

## 2023-06-16 VITALS — BP 136/86 | HR 60 | Temp 98.0°F | Ht 60.0 in | Wt 194.0 lb

## 2023-06-16 DIAGNOSIS — Z6837 Body mass index (BMI) 37.0-37.9, adult: Secondary | ICD-10-CM | POA: Diagnosis not present

## 2023-06-16 DIAGNOSIS — R7303 Prediabetes: Secondary | ICD-10-CM

## 2023-06-16 DIAGNOSIS — K760 Fatty (change of) liver, not elsewhere classified: Secondary | ICD-10-CM

## 2023-06-16 DIAGNOSIS — E559 Vitamin D deficiency, unspecified: Secondary | ICD-10-CM

## 2023-06-16 NOTE — Assessment & Plan Note (Signed)
Patient has lost 24 pounds or 12% of total body weight.  She is currently on metformin XR 500 mg twice daily.  We are repeating hemoglobin A1c today.  She will continue with medically supervised weight loss plan

## 2023-06-16 NOTE — Progress Notes (Signed)
Office: [phone]  /  Fax: [phone]  WEIGHT SUMMARY AND BIOMETRICS  Vitals Temp: 98 F (36.7 C) BP: 136/86 Pulse Rate: 60 SpO2: 97 %   Anthropometric Measurements Height: 5' (1.524 m) Weight: 194 lb (88 kg) BMI (Calculated): 37.89 Weight at Last Visit: 196 lb Weight Lost Since Last Visit: 2 lb Weight Gained Since Last Visit: 0 lb Starting Weight: 215 lb Total Weight Loss (lbs): 21 lb (9.526 kg)   Body Composition  Body Fat %: 45.2 % Fat Mass (lbs): 88 lbs Muscle Mass (lbs): 101.4 lbs Total Body Water (lbs): 66.6 lbs Visceral Fat Rating : 15    No data recorded Today's Visit #: 6  Starting Date: 02/19/23   HPI  Chief Complaint: OBESITY  Nicole Robles is here to discuss her progress with her obesity treatment plan. She is on the the Category 1 Plan and states she is following her eating plan approximately 60 % of the time. She states she is exercising 20 minutes 3-4 times per week.  Interval History:  Since last office visit she has lost 2 lbs. She reports fair adherence to reduced calorie nutritional plan. She has been working on not skipping meals, increasing protein intake at every meal, drinking more water, making healthier choices, begun to exercise, and reducing portion sizes. Doing protein shakes but infrequent.  Orexigenic Control: Denies problems with appetite and hunger signals.  Denies problems with satiety and satiation.  Denies problems with eating patterns and portion control.  Denies abnormal cravings. Denies feeling deprived or restricted.   Barriers identified: none.   Pharmacotherapy for weight loss: She is currently taking Metformin (off label use for incretin effect and / or insulin resistance and / or diabetes prevention) with adequate clinical response  and without side effects..    ASSESSMENT AND PLAN  TREATMENT PLAN FOR OBESITY:  Recommended Dietary Goals  [name] is currently in the action stage of change. As such, her  goal is to continue weight management plan. She has agreed to: continue current plan  Behavioral Intervention  We discussed the following Behavioral Modification Strategies today: increasing lean protein intake, decreasing simple carbohydrates , increasing vegetables, increasing lower glycemic fruits, increasing fiber rich foods, increasing water intake, work on meal planning and preparation, keeping healthy foods at home, continue to practice mindfulness when eating, and planning for success.  Additional resources provided today: None  Recommended Physical Activity Goals  [name] has been advised to work up to 150 minutes of moderate intensity aerobic activity a week and strengthening exercises 2-3 times per week for cardiovascular health, weight loss maintenance and preservation of muscle mass.   She has agreed to :  Continue current level of physical activity   Pharmacotherapy We discussed various medication options to help [name] with her weight loss efforts and we both agreed to : continue current anti-obesity medication regimen  ASSOCIATED CONDITIONS ADDRESSED TODAY  Prediabetes Assessment & Plan: Patient has lost 24 pounds or 12% of total body weight.  She is currently on metformin XR 500 mg twice daily.  We are repeating hemoglobin A1c today.  She will continue with medically supervised weight loss plan  Orders: -     Hemoglobin A1c  Class 3 severe obesity with serious comorbidity and body mass index (BMI) of 40.0 to 44.9 in adult, unspecified obesity type (HCC) Assessment & Plan: Peak weight 218 patient has lost 24 pounds or 12% of total body weight.  BIA shows a reduction in body fat percentage and preservation of muscle  mass.  Were checking hemoglobin A1c today.  She will continue on metformin twice daily for diabetes prevention and incretin effect.  She work on increasing aerobic physical activity I will like for her to do some strengthening as well.  She has reduced simple  and processed carbs from her diet.   Hepatic steatosis  Vitamin D deficiency Assessment & Plan: Most recent vitamin D levels  Lab Results  Component Value Date   VD25OH 27.0 (L) 02/19/2023     Deficiency state associated with adiposity and may result in leptin resistance, weight gain and fatigue. Currently on vitamin D supplementation without any adverse effects.  Plan: Check vitamin D levels today if levels are acceptable she will be transition to over-the-counter supplementation.   Orders: -     VITAMIN D 25 Hydroxy (Vit-D Deficiency, Fractures)    PHYSICAL EXAM:  Blood pressure 136/86, pulse 60, temperature 98 F (36.7 C), height 5' (1.524 m), weight 194 lb (88 kg), SpO2 97%. Body mass index is 37.89 kg/m.  General: She is overweight, cooperative, alert, well developed, and in no acute distress. PSYCH: Has normal mood, affect and thought process.   HEENT: EOMI, sclerae are anicteric. Lungs: Normal breathing effort, no conversational dyspnea. Extremities: No edema.  Neurologic: No gross sensory or motor deficits. No tremors or fasciculations noted.    DIAGNOSTIC DATA REVIEWED:  BMET    Component Value Date/Time   NA 142 02/19/2023 0941   K 4.1 02/19/2023 0941   CL 101 02/19/2023 0941   CO2 24 02/19/2023 0941   GLUCOSE 99 02/19/2023 0941   GLUCOSE 107 (H) 03/03/2020 0407   BUN 11 02/19/2023 0941   CREATININE 0.85 02/19/2023 0941   CALCIUM 9.0 02/19/2023 0941   GFRNONAA >60 03/03/2020 0407   GFRAA >60 03/03/2020 0407   Lab Results  Component Value Date   HGBA1C 6.3 (H) 12/23/2022   Lab Results  Component Value Date   INSULIN 23.8 02/19/2023   Lab Results  Component Value Date   TSH 4.470 12/23/2022   CBC    Component Value Date/Time   WBC 9.0 02/19/2023 0941   WBC 8.3 03/03/2020 0407   RBC 4.54 02/19/2023 0941   RBC 4.45 03/03/2020 0407   HGB 12.7 02/19/2023 0941   HCT 40.5 02/19/2023 0941   PLT 212 02/19/2023 0941   MCV 89 02/19/2023 0941    MCH 28.0 02/19/2023 0941   MCH 28.3 03/03/2020 0407   MCHC 31.4 (L) 02/19/2023 0941   MCHC 32.1 03/03/2020 0407   RDW 12.9 02/19/2023 0941   Iron Studies No results found for: "IRON", "TIBC", "FERRITIN", "IRONPCTSAT" Lipid Panel     Component Value Date/Time   CHOL 160 12/23/2022 1158   TRIG 172 (H) 12/23/2022 1158   HDL 53 12/23/2022 1158   LDLCALC 78 12/23/2022 1158   Hepatic Function Panel     Component Value Date/Time   PROT 6.7 02/19/2023 0941   ALBUMIN 4.1 02/19/2023 0941   AST 29 02/19/2023 0941   ALT 27 02/19/2023 0941   ALKPHOS 82 02/19/2023 0941   BILITOT 0.4 02/19/2023 0941      Component Value Date/Time   TSH 4.470 12/23/2022 1158   Nutritional Lab Results  Component Value Date   VD25OH 27.0 (L) 02/19/2023     Return in about 4 weeks (around 07/14/2023) for For Weight Mangement with Dr. Rikki Spearing.Marland Kitchen She was informed of the importance of frequent follow up visits to maximize her success with intensive lifestyle modifications for her multiple  health conditions.   ATTESTASTION STATEMENTS:  Reviewed by clinician on day of visit: allergies, medications, problem list, medical history, surgical history, family history, social history, and previous encounter notes.     Worthy Rancher, MD

## 2023-06-16 NOTE — Assessment & Plan Note (Signed)
Peak weight 218 patient has lost 24 pounds or 12% of total body weight.  BIA shows a reduction in body fat percentage and preservation of muscle mass.  Were checking hemoglobin A1c today.  She will continue on metformin twice daily for diabetes prevention and incretin effect.  She work on increasing aerobic physical activity I will like for her to do some strengthening as well.  She has reduced simple and processed carbs from her diet.

## 2023-06-16 NOTE — Assessment & Plan Note (Signed)
Most recent vitamin D levels  Lab Results  Component Value Date   VD25OH 27.0 (L) 02/19/2023     Deficiency state associated with adiposity and may result in leptin resistance, weight gain and fatigue. Currently on vitamin D supplementation without any adverse effects.  Plan: Check vitamin D levels today if levels are acceptable she will be transition to over-the-counter supplementation.

## 2023-06-17 LAB — HEMOGLOBIN A1C
Est. average glucose Bld gHb Est-mCnc: 137 mg/dL
Hgb A1c MFr Bld: 6.4 % — ABNORMAL HIGH (ref 4.8–5.6)

## 2023-06-17 LAB — VITAMIN D 25 HYDROXY (VIT D DEFICIENCY, FRACTURES): Vit D, 25-Hydroxy: 54.2 ng/mL (ref 30.0–100.0)

## 2023-06-24 DIAGNOSIS — E039 Hypothyroidism, unspecified: Secondary | ICD-10-CM | POA: Diagnosis not present

## 2023-06-24 DIAGNOSIS — Z6841 Body Mass Index (BMI) 40.0 and over, adult: Secondary | ICD-10-CM | POA: Diagnosis not present

## 2023-06-24 DIAGNOSIS — G43909 Migraine, unspecified, not intractable, without status migrainosus: Secondary | ICD-10-CM | POA: Diagnosis not present

## 2023-06-24 DIAGNOSIS — E785 Hyperlipidemia, unspecified: Secondary | ICD-10-CM | POA: Diagnosis not present

## 2023-06-24 DIAGNOSIS — J4520 Mild intermittent asthma, uncomplicated: Secondary | ICD-10-CM | POA: Diagnosis not present

## 2023-06-24 DIAGNOSIS — F17211 Nicotine dependence, cigarettes, in remission: Secondary | ICD-10-CM | POA: Diagnosis not present

## 2023-06-24 DIAGNOSIS — Z008 Encounter for other general examination: Secondary | ICD-10-CM | POA: Diagnosis not present

## 2023-06-24 DIAGNOSIS — F3341 Major depressive disorder, recurrent, in partial remission: Secondary | ICD-10-CM | POA: Diagnosis not present

## 2023-06-24 DIAGNOSIS — G4733 Obstructive sleep apnea (adult) (pediatric): Secondary | ICD-10-CM | POA: Diagnosis not present

## 2023-06-24 DIAGNOSIS — R7303 Prediabetes: Secondary | ICD-10-CM | POA: Diagnosis not present

## 2023-06-27 ENCOUNTER — Other Ambulatory Visit: Payer: Self-pay | Admitting: Family Medicine

## 2023-06-27 DIAGNOSIS — Z8669 Personal history of other diseases of the nervous system and sense organs: Secondary | ICD-10-CM

## 2023-06-30 ENCOUNTER — Encounter: Payer: Self-pay | Admitting: Family Medicine

## 2023-06-30 ENCOUNTER — Encounter: Payer: Self-pay | Admitting: Gastroenterology

## 2023-06-30 ENCOUNTER — Ambulatory Visit (INDEPENDENT_AMBULATORY_CARE_PROVIDER_SITE_OTHER): Payer: No Typology Code available for payment source | Admitting: Family Medicine

## 2023-06-30 VITALS — BP 122/78 | HR 63 | Ht 60.0 in | Wt 197.0 lb

## 2023-06-30 DIAGNOSIS — F3341 Major depressive disorder, recurrent, in partial remission: Secondary | ICD-10-CM | POA: Diagnosis not present

## 2023-06-30 DIAGNOSIS — Z1231 Encounter for screening mammogram for malignant neoplasm of breast: Secondary | ICD-10-CM

## 2023-06-30 DIAGNOSIS — E782 Mixed hyperlipidemia: Secondary | ICD-10-CM

## 2023-06-30 DIAGNOSIS — E034 Atrophy of thyroid (acquired): Secondary | ICD-10-CM

## 2023-06-30 DIAGNOSIS — Z8669 Personal history of other diseases of the nervous system and sense organs: Secondary | ICD-10-CM

## 2023-06-30 DIAGNOSIS — F5101 Primary insomnia: Secondary | ICD-10-CM

## 2023-06-30 DIAGNOSIS — Z23 Encounter for immunization: Secondary | ICD-10-CM

## 2023-06-30 MED ORDER — LEVOTHYROXINE SODIUM 88 MCG PO TABS
88.0000 ug | ORAL_TABLET | Freq: Every day | ORAL | 1 refills | Status: DC
Start: 2023-06-30 — End: 2023-12-11

## 2023-06-30 MED ORDER — SUMATRIPTAN SUCCINATE 100 MG PO TABS: 100.0000 mg | ORAL_TABLET | Freq: Once | ORAL | 0 refills | Status: DC

## 2023-06-30 MED ORDER — PRAVASTATIN SODIUM 10 MG PO TABS
10.0000 mg | ORAL_TABLET | Freq: Every day | ORAL | 1 refills | Status: DC
Start: 2023-06-30 — End: 2023-12-11

## 2023-06-30 MED ORDER — TRAZODONE HCL 50 MG PO TABS
50.0000 mg | ORAL_TABLET | Freq: Every day | ORAL | 1 refills | Status: DC
Start: 2023-06-30 — End: 2023-07-21

## 2023-06-30 MED ORDER — SERTRALINE HCL 50 MG PO TABS
50.0000 mg | ORAL_TABLET | Freq: Every day | ORAL | 1 refills | Status: DC
Start: 2023-06-30 — End: 2023-12-11

## 2023-06-30 NOTE — Progress Notes (Signed)
Date:  06/30/2023   Name:  Nicole Robles   DOB:  1950/12/29   MRN:  [id number]   Chief Complaint: Hypothyroidism, Depression, Hyperlipidemia, and Flu Vaccine  Depression        This is a chronic problem.  The current episode started more than 1 year ago.   The onset quality is gradual.   The problem has been gradually improving since onset.  Associated symptoms include no decreased concentration, no fatigue, no helplessness, no hopelessness, does not have insomnia, not irritable, no restlessness, no decreased interest, no appetite change, no body aches, no myalgias, no headaches, no indigestion, not sad and no suicidal ideas.  Past treatments include SSRIs - Selective serotonin reuptake inhibitors.  Compliance with treatment is good. Hyperlipidemia This is a chronic problem. The current episode started more than 1 year ago. The problem is controlled. Recent lipid tests were reviewed and are normal. She has no history of chronic renal disease. Pertinent negatives include no chest pain, focal sensory loss, focal weakness, myalgias or shortness of breath. Current antihyperlipidemic treatment includes statins. The current treatment provides moderate improvement of lipids. There are no compliance problems.  Risk factors for coronary artery disease include dyslipidemia.    Lab Results  Component Value Date   NA 142 02/19/2023   K 4.1 02/19/2023   CO2 24 02/19/2023   GLUCOSE 99 02/19/2023   BUN 11 02/19/2023   CREATININE 0.85 02/19/2023   CALCIUM 9.0 02/19/2023   EGFR 72 02/19/2023   GFRNONAA >60 03/03/2020   Lab Results  Component Value Date   CHOL 160 12/23/2022   HDL 53 12/23/2022   LDLCALC 78 12/23/2022   TRIG 172 (H) 12/23/2022   Lab Results  Component Value Date   TSH 4.470 12/23/2022   Lab Results  Component Value Date   HGBA1C 6.4 (H) 06/16/2023   Lab Results  Component Value Date   WBC 9.0 02/19/2023   HGB 12.7 02/19/2023   HCT 40.5 02/19/2023   MCV 89  02/19/2023   PLT 212 02/19/2023   Lab Results  Component Value Date   ALT 27 02/19/2023   AST 29 02/19/2023   ALKPHOS 82 02/19/2023   BILITOT 0.4 02/19/2023   Lab Results  Component Value Date   VD25OH 54.2 06/16/2023     Review of Systems  Constitutional:  Negative for appetite change and fatigue.  HENT:  Negative for trouble swallowing.   Respiratory:  Negative for chest tightness and shortness of breath.   Cardiovascular:  Negative for chest pain, palpitations and leg swelling.  Gastrointestinal:  Negative for blood in stool.  Musculoskeletal:  Negative for myalgias.  Neurological:  Negative for focal weakness and headaches.  Psychiatric/Behavioral:  Positive for depression. Negative for decreased concentration and suicidal ideas. The patient does not have insomnia.     Patient Active Problem List   Diagnosis Date Noted   Hepatic steatosis 05/21/2023   Diverticulitis 05/21/2023   Vitamin D deficiency 03/05/2023   OSA (obstructive sleep apnea) 02/06/2023   Class 3 severe obesity with serious comorbidity and body mass index (BMI) of 40.0 to 44.9 in adult [facility]) 02/06/2023   Prediabetes 06/17/2022   Asthmatic bronchitis , chronic 12/19/2020   Hyperlipidemia, mixed 08/04/2018   Hypothyroidism 07/28/2018   Recurrent major depressive disorder, in partial remission (HCC) 07/28/2018    Allergies  Allergen Reactions   Augmentin [Amoxicillin-Pot Clavulanate] Hives   Darvon [Propoxyphene] Nausea And Vomiting   Demerol [Meperidine Hcl]     Past  Surgical History:  Procedure Laterality Date   ABDOMINAL HYSTERECTOMY     CHOLECYSTECTOMY     heart ablation      Social History   Tobacco Use   Smoking status: Former    Current packs/day: 0.00    Average packs/day: 1.5 packs/day for 20.0 years (30.0 ttl pk-yrs)    Types: Cigarettes    Start date: 10/07/1976    Quit date: 10/07/1996    Years since quitting: 26.7   Smokeless tobacco: Never  Vaping Use   Vaping status: Never  Used  Substance Use Topics   Alcohol use: Never   Drug use: Never     Medication list has been reviewed and updated.  Current Meds  Medication Sig   albuterol (VENTOLIN HFA) 108 (90 Base) MCG/ACT inhaler INHALE 2 TO 4 PUFFS BY MOUTH EVERY 4 HOURS AS NEEDED FOR WHEEZING OR COUGH OR SHORTNESS OF BREATH   aspirin EC 81 MG tablet Take 81 mg by mouth daily.   metFORMIN (GLUCOPHAGE-XR) 500 MG 24 hr tablet TAKE 1 TABLET(500 MG) BY MOUTH TWICE DAILY WITH A MEAL   metoprolol tartrate (LOPRESSOR) 50 MG tablet Take 50 mg by mouth daily. cardio   Multiple Vitamin (MULTIVITAMIN) capsule Take 1 capsule by mouth daily. Hair and skin   omeprazole (PRILOSEC) 20 MG capsule Take 20 mg by mouth 2 (two) times daily before a meal. GI   traZODone (DESYREL) 50 MG tablet Take 1 tablet (50 mg total) by mouth at bedtime.   [DISCONTINUED] levothyroxine (SYNTHROID) 88 MCG tablet Take 1 tablet (88 mcg total) by mouth daily before breakfast.   [DISCONTINUED] pravastatin (PRAVACHOL) 10 MG tablet Take 1 tablet (10 mg total) by mouth daily.   [DISCONTINUED] sertraline (ZOLOFT) 25 MG tablet Take 1 tablet (25 mg total) by mouth daily.   [DISCONTINUED] sertraline (ZOLOFT) 50 MG tablet Take 50 mg by mouth daily.   [DISCONTINUED] SUMAtriptan (IMITREX) 100 MG tablet Take 1 tablet (100 mg total) by mouth once for 1 dose. Repeat in 2 hrs x 1 if cont headache       06/30/2023   10:11 AM 05/12/2023    3:26 PM 12/23/2022   10:57 AM 11/29/2022    1:26 PM  GAD 7 : Generalized Anxiety Score  Nervous, Anxious, on Edge 0 0 0 0  Control/stop worrying 0 0 0 0  Worry too much - different things 0 0 0 0  Trouble relaxing 0 0 0 0  Restless 0 0 0 0  Easily annoyed or irritable 0 0 0 0  Afraid - awful might happen 0 0 0 0  Total GAD 7 Score 0 0 0 0  Anxiety Difficulty Not difficult at all Not difficult at all Not difficult at all Not difficult at all       06/30/2023   10:11 AM 05/12/2023    3:26 PM 12/23/2022   10:57 AM  Depression  screen PHQ 2/9  Decreased Interest 0 0 0  Down, Depressed, Hopeless 0 0 0  PHQ - 2 Score 0 0 0  Altered sleeping 0 0 0  Tired, decreased energy 0 0 0  Change in appetite 0 0 0  Feeling bad or failure about yourself  0 0 0  Trouble concentrating 0 0 0  Moving slowly or fidgety/restless 0 0 0  Suicidal thoughts 0 0 0  PHQ-9 Score 0 0 0  Difficult doing work/chores Not difficult at all Not difficult at all Not difficult at all    BP  Readings from Last 3 Encounters:  06/30/23 122/78  06/16/23 136/86  05/21/23 136/84    Physical Exam Vitals and nursing note reviewed. Exam conducted with a chaperone present.  Constitutional:      General: She is not irritable.She is not in acute distress.    Appearance: She is not diaphoretic.  HENT:     Head: Normocephalic and atraumatic.     Right Ear: Tympanic membrane and external ear normal.     Left Ear: Tympanic membrane and external ear normal.     Nose: Nose normal.     Mouth/Throat:     Mouth: Mucous membranes are moist.  Eyes:     General:        Right eye: No discharge.        Left eye: No discharge.     Conjunctiva/sclera: Conjunctivae normal.     Pupils: Pupils are equal, round, and reactive to light.  Neck:     Thyroid: No thyromegaly.     Vascular: No JVD.  Cardiovascular:     Rate and Rhythm: Normal rate and regular rhythm.     Heart sounds: Normal heart sounds. No murmur heard.    No friction rub. No gallop.  Pulmonary:     Effort: Pulmonary effort is normal.     Breath sounds: Normal breath sounds.  Chest:  Breasts:    Right: Normal. No swelling, bleeding, inverted nipple, mass, nipple discharge, skin change or tenderness.     Left: Normal. No swelling, bleeding, inverted nipple, mass, nipple discharge, skin change or tenderness.  Abdominal:     General: Bowel sounds are normal.     Palpations: Abdomen is soft. There is no mass.     Tenderness: There is no abdominal tenderness. There is no guarding.   Musculoskeletal:        General: Normal range of motion.     Cervical back: Normal range of motion and neck supple.  Lymphadenopathy:     Cervical: No cervical adenopathy.  Skin:    General: Skin is warm and dry.  Neurological:     Mental Status: She is alert.     Deep Tendon Reflexes: Reflexes are normal and symmetric.     Wt Readings from Last 3 Encounters:  06/30/23 197 lb (89.4 kg)  06/16/23 194 lb (88 kg)  05/21/23 196 lb (88.9 kg)    BP 122/78   Pulse 63   Ht 5' (1.524 m)   Wt 197 lb (89.4 kg)   SpO2 95%   BMI 38.47 kg/m   Assessment and Plan:  1. 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  Controlled.  Stable.  Currently tolerating dosage of levothyroxine 88 mcg daily and we will continue pending TSH measurement. - levothyroxine (SYNTHROID) 88 MCG tablet; Take 1 tablet (88 mcg total) by mouth daily before breakfast.  Dispense: 90 tablet; Refill: 1 - TSH  2. Hyperlipidemia, mixed Chronic.  Controlled.  Stable.  Asymptomatic.  Without myalgias.  Continue pravastatin 10 mg once a day.  Review of previous lipid panel acceptable. - pravastatin (PRAVACHOL) 10 MG tablet; Take 1 tablet (10 mg total) by mouth daily.  Dispense: 90 tablet; Refill: 1  3. Recurrent major depressive disorder, in partial remission (HCC) Chronic.  Controlled.  Stable.  PHQ is 0 GAD score 0 continue sertraline 50 mg daily.  Will recheck in 6 months. - sertraline (ZOLOFT) 50 MG tablet; Take 1 tablet (50 mg total) by mouth daily.  Dispense: 90 tablet; Refill: 1  4. History of migraine headaches Chronic.  Controlled.  Stable.  Continue with as needed Imitrex for headaches. - SUMAtriptan (IMITREX) 100 MG tablet; Take 1 tablet (100 mg total) by mouth once for 1 dose. Repeat in 2 hrs x 1 if cont headache  Dispense: 9 tablet; Refill: 0  5. Primary insomnia Chronic.  Controlled.  Stable.  Continue trazodone 50 mg as needed   6. Need for immunization against influenza Discussed and  administered - Flu Vaccine Trivalent High Dose (Fluad)  7.  Breast cancer screening screening mammogram ordered. Screening mammogram ordered. Elizabeth Sauer, MD

## 2023-06-30 NOTE — Telephone Encounter (Signed)
Refused the Imitrex 100 mg because Dr. Yetta Barre has already signed and sent it earlier today.   This is a duplicate.

## 2023-06-30 NOTE — Anesthesia Preprocedure Evaluation (Addendum)
Anesthesia Evaluation  Patient identified by MRN, date of birth, ID band Patient awake    Reviewed: Allergy & Precautions, H&P , NPO status , Patient's Chart, lab work & pertinent test results  Airway Mallampati: II  TM Distance: >3 FB Neck ROM: Full    Dental no notable dental hx. (+) Upper Dentures Lower teeth are mostly implants, some native teeth:   Pulmonary asthma , sleep apnea , COPD, former smoker   Pulmonary exam normal breath sounds clear to auscultation       Cardiovascular + CAD  Normal cardiovascular exam+ dysrhythmias Supra Ventricular Tachycardia  Rhythm:Regular Rate:Normal  Coronary artery disease involving native coronary artery of native heart without angina pectoris (Primary Dx);  History of supraventricular tachycardia;  AVNRT (AV nodal re-entry tachycardia);  S/P ablation operation for arrhythmia;  Hyperlipidemia, mixed;   Cardiac clinic note 04-16-23 Has been increasing activity levels and is tolerating exercise well. Down 11 lbs since her last appointment in our clinic. Denies any exertional chest pain or shortness of breath. No functional limitiations. Tolerating her current medication regimen well without issue. HR well controlled today on metoprolol. She reports rare episodes of palpitations but overall these are not bothersome. 06-18-19 negative stress test, EF 72%   Neuro/Psych  Headaches PSYCHIATRIC DISORDERS  Depression    negative neurological ROS  negative psych ROS   GI/Hepatic Neg liver ROS,GERD  ,,  Endo/Other  Hypothyroidism    Renal/GU negative Renal ROS  negative genitourinary   Musculoskeletal negative musculoskeletal ROS (+)    Abdominal   Peds negative pediatric ROS (+)  Hematology negative hematology ROS (+)   Anesthesia Other Findings GERD (gastroesophageal reflux disease) High cholesterol Depression  Thyroid disease SVT (supraventricular tachycardia) Palpitations COPD  (chronic obstructive pulmonary disease)   Back pain Joint pain  Vitamin D deficiency Constipation  Hx of myocardial infarction Sleep apnea  Pre-diabetes Wears dentures  Migraine headache obesity CAD  Reproductive/Obstetrics negative OB ROS                             Anesthesia Physical Anesthesia Plan  ASA: 3  Anesthesia Plan: General   Post-op Pain Management:    Induction: Intravenous  PONV Risk Score and Plan:   Airway Management Planned: Natural Airway and Nasal Cannula  Additional Equipment:   Intra-op Plan:   Post-operative Plan:   Informed Consent: I have reviewed the patients History and Physical, chart, labs and discussed the procedure including the risks, benefits and alternatives for the proposed anesthesia with the patient or authorized representative who has indicated his/her understanding and acceptance.     Dental Advisory Given  Plan Discussed with: Anesthesiologist, CRNA and Surgeon  Anesthesia Plan Comments: (Patient consented for risks of anesthesia including but not limited to:  - adverse reactions to medications - risk of airway placement if required - damage to eyes, teeth, lips or other oral mucosa - nerve damage due to positioning  - sore throat or hoarseness - Damage to heart, brain, nerves, lungs, other parts of body or loss of life  Patient voiced understanding.)        Anesthesia Quick Evaluation

## 2023-07-01 ENCOUNTER — Encounter: Payer: Self-pay | Admitting: Family Medicine

## 2023-07-01 LAB — TSH: TSH: 1.39 u[IU]/mL (ref 0.450–4.500)

## 2023-07-04 ENCOUNTER — Ambulatory Visit: Payer: No Typology Code available for payment source | Admitting: Anesthesiology

## 2023-07-04 ENCOUNTER — Other Ambulatory Visit: Payer: Self-pay

## 2023-07-04 ENCOUNTER — Encounter: Payer: Self-pay | Admitting: Gastroenterology

## 2023-07-04 ENCOUNTER — Encounter
Admission: RE | Disposition: A | Discharge status: Home / Self Care | Payer: Self-pay | Source: Home / Self Care | Attending: Gastroenterology

## 2023-07-04 ENCOUNTER — Ambulatory Visit
Admission: RE | Admit: 2023-07-04 | Discharge: 2023-07-04 | Disposition: A | Discharge status: Home / Self Care | Payer: No Typology Code available for payment source | Attending: Gastroenterology | Admitting: Gastroenterology

## 2023-07-04 DIAGNOSIS — E782 Mixed hyperlipidemia: Secondary | ICD-10-CM | POA: Insufficient documentation

## 2023-07-04 DIAGNOSIS — E669 Obesity, unspecified: Secondary | ICD-10-CM | POA: Insufficient documentation

## 2023-07-04 DIAGNOSIS — I2510 Atherosclerotic heart disease of native coronary artery without angina pectoris: Secondary | ICD-10-CM | POA: Insufficient documentation

## 2023-07-04 DIAGNOSIS — F32A Depression, unspecified: Secondary | ICD-10-CM | POA: Insufficient documentation

## 2023-07-04 DIAGNOSIS — K5730 Diverticulosis of large intestine without perforation or abscess without bleeding: Secondary | ICD-10-CM | POA: Diagnosis not present

## 2023-07-04 DIAGNOSIS — I472 Ventricular tachycardia, unspecified: Secondary | ICD-10-CM | POA: Diagnosis not present

## 2023-07-04 DIAGNOSIS — E039 Hypothyroidism, unspecified: Secondary | ICD-10-CM | POA: Insufficient documentation

## 2023-07-04 DIAGNOSIS — G43909 Migraine, unspecified, not intractable, without status migrainosus: Secondary | ICD-10-CM | POA: Insufficient documentation

## 2023-07-04 DIAGNOSIS — J449 Chronic obstructive pulmonary disease, unspecified: Secondary | ICD-10-CM | POA: Insufficient documentation

## 2023-07-04 DIAGNOSIS — R7303 Prediabetes: Secondary | ICD-10-CM | POA: Insufficient documentation

## 2023-07-04 DIAGNOSIS — Z86010 Personal history of colonic polyps: Secondary | ICD-10-CM | POA: Diagnosis not present

## 2023-07-04 DIAGNOSIS — I252 Old myocardial infarction: Secondary | ICD-10-CM | POA: Diagnosis not present

## 2023-07-04 DIAGNOSIS — Z1211 Encounter for screening for malignant neoplasm of colon: Secondary | ICD-10-CM

## 2023-07-04 DIAGNOSIS — Z6837 Body mass index (BMI) 37.0-37.9, adult: Secondary | ICD-10-CM | POA: Insufficient documentation

## 2023-07-04 DIAGNOSIS — E559 Vitamin D deficiency, unspecified: Secondary | ICD-10-CM | POA: Insufficient documentation

## 2023-07-04 DIAGNOSIS — K640 First degree hemorrhoids: Secondary | ICD-10-CM | POA: Insufficient documentation

## 2023-07-04 DIAGNOSIS — R002 Palpitations: Secondary | ICD-10-CM | POA: Insufficient documentation

## 2023-07-04 DIAGNOSIS — Z87891 Personal history of nicotine dependence: Secondary | ICD-10-CM | POA: Insufficient documentation

## 2023-07-04 DIAGNOSIS — K219 Gastro-esophageal reflux disease without esophagitis: Secondary | ICD-10-CM | POA: Insufficient documentation

## 2023-07-04 DIAGNOSIS — G4733 Obstructive sleep apnea (adult) (pediatric): Secondary | ICD-10-CM | POA: Diagnosis not present

## 2023-07-04 HISTORY — DX: Prediabetes: R73.03

## 2023-07-04 HISTORY — DX: Obstructive sleep apnea (adult) (pediatric): G47.33

## 2023-07-04 HISTORY — DX: Presence of dental prosthetic device (complete) (partial): Z97.2

## 2023-07-04 HISTORY — PX: COLONOSCOPY WITH PROPOFOL: SHX5780

## 2023-07-04 HISTORY — DX: Other specified chronic obstructive pulmonary disease: J44.89

## 2023-07-04 HISTORY — DX: Migraine, unspecified, not intractable, without status migrainosus: G43.909

## 2023-07-04 HISTORY — DX: Atherosclerotic heart disease of native coronary artery without angina pectoris: I25.10

## 2023-07-04 LAB — GLUCOSE, CAPILLARY: Glucose-Capillary: 126 mg/dL — ABNORMAL HIGH (ref 70–99)

## 2023-07-04 SURGERY — COLONOSCOPY WITH PROPOFOL
Anesthesia: General | Site: Rectum

## 2023-07-04 MED ORDER — STERILE WATER FOR IRRIGATION IR SOLN
Status: DC | PRN
  Administered 2023-07-04: 100 mL

## 2023-07-04 MED ORDER — LACTATED RINGERS IV SOLN: INTRAVENOUS | Status: DC

## 2023-07-04 MED ORDER — LIDOCAINE HCL (CARDIAC) PF 100 MG/5ML IV SOSY
PREFILLED_SYRINGE | INTRAVENOUS | Status: DC | PRN
Start: 2023-07-04 — End: 2023-07-04
  Administered 2023-07-04: 40 mg via INTRATRACHEAL

## 2023-07-04 MED ORDER — PROPOFOL 10 MG/ML IV BOLUS
INTRAVENOUS | Status: DC | PRN
Start: 2023-07-04 — End: 2023-07-04
  Administered 2023-07-04 (×3): 20 mg via INTRAVENOUS
  Administered 2023-07-04: 50 mg via INTRAVENOUS
  Administered 2023-07-04 (×3): 20 mg via INTRAVENOUS

## 2023-07-04 SURGICAL SUPPLY — 6 items
GOWN CVR UNV OPN BCK APRN NK (MISCELLANEOUS) ×2 IMPLANT
GOWN ISOL THUMB LOOP REG UNIV (MISCELLANEOUS) ×2
KIT PRC NS LF DISP ENDO (KITS) ×1 IMPLANT
KIT PROCEDURE OLYMPUS (KITS) ×1
MANIFOLD NEPTUNE II (INSTRUMENTS) ×1 IMPLANT
WATER STERILE IRR 250ML POUR (IV SOLUTION) ×1 IMPLANT

## 2023-07-04 NOTE — Op Note (Signed)
[facility] Gastroenterology Patient Name: Nicole Robles Procedure Date: 07/04/2023 7:53 AM MRN: [id number] Account #: [id number] Date of Birth: 12-04-1950 Admit Type: Outpatient Age: 72 Room: [facility] OR ROOM 01 Gender: Female Note Status: Finalized Instrument Name: [id number] Procedure:             Colonoscopy Indications:           Screening for colorectal malignant neoplasm Providers:             Midge Minium MD, MD Referring MD:          Duanne Limerick, MD (Referring MD) Medicines:             Propofol per Anesthesia Complications:         No immediate complications. Procedure:             Pre-Anesthesia Assessment:                        - Prior to the procedure, a History and Physical was                         performed, and patient medications and allergies were                         reviewed. The patient's tolerance of previous                         anesthesia was also reviewed. The risks and benefits                         of the procedure and the sedation options and risks                         were discussed with the patient. All questions were                         answered, and informed consent was obtained. Prior                         Anticoagulants: The patient has taken no anticoagulant                         or antiplatelet agents. ASA Grade Assessment: II - A                         patient with mild systemic disease. After reviewing                         the risks and benefits, the patient was deemed in                         satisfactory condition to undergo the procedure.                        After obtaining informed consent, the colonoscope was                         passed under direct vision. Throughout the procedure,  the patient's blood pressure, pulse, and oxygen                         saturations were monitored continuously. The                         Colonoscope was introduced through the  anus and                         advanced to the the cecum, identified by appendiceal                         orifice and ileocecal valve. The colonoscopy was                         performed without difficulty. The patient tolerated                         the procedure well. The quality of the bowel                         preparation was excellent. Findings:      The perianal and digital rectal examinations were normal.      Multiple small-mouthed diverticula were found in the entire colon.      Non-bleeding internal hemorrhoids were found during retroflexion. The       hemorrhoids were Grade I (internal hemorrhoids that do not prolapse). Impression:            - Diverticulosis in the entire examined colon.                        - Non-bleeding internal hemorrhoids.                        - No specimens collected. Recommendation:        - Discharge patient to home.                        - Resume previous diet.                        - Continue present medications.                        - Repeat colonoscopy is not recommended for screening                         purposes. Procedure Code(s):     --- Professional ---                        [phone], Colonoscopy, flexible; diagnostic, including                         collection of specimen(s) by brushing or washing, when                         performed (separate procedure) Diagnosis Code(s):     --- Professional ---  Z12.11, Encounter for screening for malignant neoplasm                         of colon CPT copyright 2022 American Medical Association. All rights reserved. The codes documented in this report are preliminary and upon coder review may  be revised to meet current compliance requirements. Midge Minium MD, MD 07/04/2023 8:14:30 AM This report has been signed electronically. Number of Addenda: 0 Note Initiated On: 07/04/2023 7:53 AM Scope Withdrawal Time: 0 hours 6 minutes 13 seconds  Total Procedure  Duration: 0 hours 10 minutes 2 seconds  Estimated Blood Loss:  Estimated blood loss: none.      [facility]

## 2023-07-04 NOTE — H&P (Signed)
Midge Minium, MD [facility] [address], [address] [place], [place] [address] Phone: [phone] Fax : [phone]  Primary Care Physician:  Duanne Limerick, MD Primary Gastroenterologist:  Dr. Servando Snare  Pre-Procedure History & Physical: HPI:  Nicole Robles is a 72 y.o. female is here for a screening colonoscopy.   Past Medical History:  Diagnosis Date   Back pain    CAD (coronary artery disease)    Chronic asthmatic bronchitis    Constipation    COPD (chronic obstructive pulmonary disease) (HCC)    Depression    GERD (gastroesophageal reflux disease)    High cholesterol    Hx of myocardial infarction    Joint pain    Migraine headache    about 1 time per month   OSA on CPAP    Palpitations    Pre-diabetes    Sleep apnea    CPAP- doesn't use   SVT (supraventricular tachycardia)    Ablation 07/30/14   Thyroid disease    Vitamin D deficiency    Wears dentures    full upper and lower    Past Surgical History:  Procedure Laterality Date   ABDOMINAL HYSTERECTOMY     CHOLECYSTECTOMY     heart ablation  07/30/2014    Prior to Admission medications   Medication Sig Start Date End Date Taking? Authorizing Provider  albuterol (VENTOLIN HFA) 108 (90 Base) MCG/ACT inhaler INHALE 2 TO 4 PUFFS BY MOUTH EVERY 4 HOURS AS NEEDED FOR WHEEZING OR COUGH OR SHORTNESS OF BREATH 04/04/23  Yes Duanne Limerick, MD  aspirin EC 81 MG tablet Take 81 mg by mouth daily.   Yes [provider]  levothyroxine (SYNTHROID) 88 MCG tablet Take 1 tablet (88 mcg total) by mouth daily before breakfast. 06/30/23  Yes Duanne Limerick, MD  metFORMIN (GLUCOPHAGE-XR) 500 MG 24 hr tablet TAKE 1 TABLET(500 MG) BY MOUTH TWICE DAILY WITH A MEAL 05/21/23  Yes Worthy Rancher, MD  metoprolol tartrate (LOPRESSOR) 50 MG tablet Take 50 mg by mouth daily. cardio   Yes [provider]  Multiple Vitamin (MULTIVITAMIN) capsule Take 1 capsule by mouth daily. Hair and skin   Yes [provider]   omeprazole (PRILOSEC) 20 MG capsule Take 20 mg by mouth 2 (two) times daily before a meal. GI   Yes [provider]  pravastatin (PRAVACHOL) 10 MG tablet Take 1 tablet (10 mg total) by mouth daily. 06/30/23  Yes Duanne Limerick, MD  sertraline (ZOLOFT) 50 MG tablet Take 1 tablet (50 mg total) by mouth daily. 06/30/23  Yes Duanne Limerick, MD  SUMAtriptan (IMITREX) 100 MG tablet Take 1 tablet (100 mg total) by mouth once for 1 dose. Repeat in 2 hrs x 1 if cont headache 06/30/23 06/30/23 Yes Duanne Limerick, MD  traZODone (DESYREL) 50 MG tablet Take 1 tablet (50 mg total) by mouth at bedtime. Patient not taking: Reported on 06/30/2023 06/30/23   Duanne Limerick, MD  Vitamin D, Ergocalciferol, (DRISDOL) 1.25 MG (50000 UNIT) CAPS capsule Take 1 capsule (50,000 Units total) by mouth every 7 (seven) days. Patient not taking: Reported on 06/30/2023 05/21/23   Worthy Rancher, MD    Allergies as of 05/15/2023 - Review Complete 05/15/2023  Allergen Reaction Noted   Augmentin [amoxicillin-pot clavulanate] Hives 01/24/2019   Darvon [propoxyphene] Nausea And Vomiting 01/24/2019   Demerol [meperidine hcl]  11/29/2022    Family History  Problem Relation Age of Onset   Heart disease Mother    Thyroid  disease Mother    Diabetes Father    Heart disease Brother    Cancer Maternal Grandmother    Diabetes Daughter    Breast cancer Maternal Aunt    Breast cancer Cousin        mat cousin    Social History   Socioeconomic History   Marital status: Married    Spouse name: Tinnie Gens   Number of children: Not on file   Years of education: Not on file   Highest education level: Some college, no degree  Occupational History   Occupation: Retired  Tobacco Use   Smoking status: Former    Current packs/day: 0.00    Average packs/day: 1.5 packs/day for 20.0 years (30.0 ttl pk-yrs)    Types: Cigarettes    Start date: 10/07/1976    Quit date: 10/07/1996    Years since quitting: 26.7   Smokeless  tobacco: Never  Vaping Use   Vaping status: Never Used  Substance and Sexual Activity   Alcohol use: Never   Drug use: Never   Sexual activity: Not Currently  Other Topics Concern   Not on file  Social History Narrative   Not on file   Social Determinants of Health   Financial Resource Strain: Patient Declined (05/06/2023)   Overall Financial Resource Strain (CARDIA)    Difficulty of Paying Living Expenses: Patient declined  Food Insecurity: Patient Declined (05/06/2023)   Hunger Vital Sign    Worried About Running Out of Food in the Last Year: Patient declined    Ran Out of Food in the Last Year: Patient declined  Transportation Needs: No Transportation Needs (05/06/2023)   PRAPARE - Administrator, Civil Service (Medical): No    Lack of Transportation (Non-Medical): No  Physical Activity: Insufficiently Active (05/06/2023)   Exercise Vital Sign    Days of Exercise per Week: 7 days    Minutes of Exercise per Session: 20 min  Stress: No Stress Concern Present (05/06/2023)   [employer] of Occupational Health - Occupational Stress Questionnaire    Feeling of Stress : Only a little  Social Connections: Unknown (05/06/2023)   Social Connection and Isolation Panel [NHANES]    Frequency of Communication with Friends and Family: Patient declined    Frequency of Social Gatherings with Friends and Family: Patient declined    Attends Religious Services: Patient declined    Database administrator or Organizations: No    Attends Engineer, structural: Not on file    Marital Status: Married  Catering manager Violence: Not on file    Review of Systems: See HPI, otherwise negative ROS  Physical Exam: BP 129/81   Pulse 84   Temp 98.2 F (36.8 C) (Temporal)   Resp 18   Ht 5' (1.524 m)   Wt 87.5 kg   SpO2 97%   BMI 37.69 kg/m  General:   Alert,  pleasant and cooperative in NAD Head:  Normocephalic and atraumatic. Neck:  Supple; no masses or  thyromegaly. Lungs:  Clear throughout to auscultation.    Heart:  Regular rate and rhythm. Abdomen:  Soft, nontender and nondistended. Normal bowel sounds, without guarding, and without rebound.   Neurologic:  Alert and  oriented x4;  grossly normal neurologically.  Impression/Plan: Nicole Robles is now here to undergo a screening colonoscopy.  Risks, benefits, and alternatives regarding colonoscopy have been reviewed with the patient.  Questions have been answered.  All parties agreeable.

## 2023-07-04 NOTE — Anesthesia Postprocedure Evaluation (Signed)
Anesthesia Post Note  Patient: Nicole Robles  Procedure(s) Performed: COLONOSCOPY WITH PROPOFOL (Rectum)  Patient location during evaluation: PACU Anesthesia Type: General Level of consciousness: awake and alert Pain management: pain level controlled Vital Signs Assessment: post-procedure vital signs reviewed and stable Respiratory status: spontaneous breathing, nonlabored ventilation, respiratory function stable and patient connected to nasal cannula oxygen Cardiovascular status: blood pressure returned to baseline and stable Postop Assessment: no apparent nausea or vomiting Anesthetic complications: no   No notable events documented.   Last Vitals:  Vitals:   07/04/23 0815 07/04/23 0825  BP: 106/67 126/66  Pulse: 93 88  Resp: 18 18  Temp: (!) 36.1 C (!) 36.1 C  SpO2: 93% 96%    Last Pain:  Vitals:   07/04/23 0825  TempSrc:   PainSc: 0-No pain                 Ashtin Rosner C Johnni Wunschel

## 2023-07-04 NOTE — Transfer of Care (Signed)
Immediate Anesthesia Transfer of Care Note  Patient: Nicole Robles  Procedure(s) Performed: COLONOSCOPY WITH PROPOFOL (Rectum)  Patient Location: PACU  Anesthesia Type: General  Level of Consciousness: awake, alert  and patient cooperative  Airway and Oxygen Therapy: Patient Spontanous Breathing and Patient connected to supplemental oxygen  Post-op Assessment: Post-op Vital signs reviewed, Patient's Cardiovascular Status Stable, Respiratory Function Stable, Patent Airway and No signs of Nausea or vomiting  Post-op Vital Signs: Reviewed and stable  Complications: No notable events documented.

## 2023-07-07 ENCOUNTER — Encounter: Payer: Self-pay | Admitting: Gastroenterology

## 2023-07-21 ENCOUNTER — Ambulatory Visit
Admission: RE | Admit: 2023-07-21 | Discharge: 2023-07-21 | Disposition: A | Discharge status: Home / Self Care | Payer: No Typology Code available for payment source | Source: Ambulatory Visit | Attending: Family Medicine | Admitting: Family Medicine

## 2023-07-21 ENCOUNTER — Encounter (INDEPENDENT_AMBULATORY_CARE_PROVIDER_SITE_OTHER): Payer: Self-pay | Admitting: Internal Medicine

## 2023-07-21 ENCOUNTER — Ambulatory Visit (INDEPENDENT_AMBULATORY_CARE_PROVIDER_SITE_OTHER): Payer: No Typology Code available for payment source | Admitting: Internal Medicine

## 2023-07-21 ENCOUNTER — Other Ambulatory Visit (INDEPENDENT_AMBULATORY_CARE_PROVIDER_SITE_OTHER): Payer: Self-pay | Admitting: Internal Medicine

## 2023-07-21 VITALS — BP 134/84 | HR 63 | Temp 98.2°F | Ht 60.0 in | Wt 192.0 lb

## 2023-07-21 DIAGNOSIS — Z1231 Encounter for screening mammogram for malignant neoplasm of breast: Secondary | ICD-10-CM | POA: Diagnosis not present

## 2023-07-21 DIAGNOSIS — E66813 Obesity, class 3: Secondary | ICD-10-CM | POA: Diagnosis not present

## 2023-07-21 DIAGNOSIS — Z6841 Body Mass Index (BMI) 40.0 and over, adult: Secondary | ICD-10-CM

## 2023-07-21 DIAGNOSIS — R7303 Prediabetes: Secondary | ICD-10-CM

## 2023-07-21 DIAGNOSIS — G4733 Obstructive sleep apnea (adult) (pediatric): Secondary | ICD-10-CM | POA: Diagnosis not present

## 2023-07-21 DIAGNOSIS — F5101 Primary insomnia: Secondary | ICD-10-CM | POA: Insufficient documentation

## 2023-07-21 MED ORDER — METFORMIN HCL ER 500 MG PO TB24: 1000.0000 mg | ORAL_TABLET | Freq: Two times a day (BID) | ORAL | 1 refills | Status: DC

## 2023-07-21 NOTE — Assessment & Plan Note (Signed)
Peak weight 218 patient has lost 26 pounds or 13% of total body weight.  The obesity treatment plan

## 2023-07-21 NOTE — Progress Notes (Signed)
Office: [phone]  /  Fax: [phone]  WEIGHT SUMMARY AND BIOMETRICS  Vitals Temp: 98.2 F (36.8 C) BP: 134/84 Pulse Rate: 63 SpO2: 95 %   Anthropometric Measurements Height: 5' (1.524 m) Weight: 192 lb (87.1 kg) BMI (Calculated): 37.5 Weight at Last Visit: 194 lb Weight Lost Since Last Visit: 2 lb Weight Gained Since Last Visit: 0 Starting Weight: 215 lb Total Weight Loss (lbs): 23 lb (10.4 kg)   Body Composition  Body Fat %: 47 % Fat Mass (lbs): 90.4 lbs Muscle Mass (lbs): 96.8 lbs Total Body Water (lbs): 67.4 lbs Visceral Fat Rating : 16    No data recorded Today's Visit #: 7  Starting Date: 02/06/23   HPI  Chief Complaint: OBESITY  Nicole Robles is here to discuss her progress with her obesity treatment plan. She is on the the Category 1 Plan and states she is following her eating plan approximately 60 % of the time. She states she is exercising 10-15 minutes 7 times per week.  Interval History:  Since last office visit she has lost 2 pounds. She reports fair adherence to reduced calorie nutritional plan. She has been working on not skipping meals, drinking more water, and making healthier choices  Orexigenic Control: Denies problems with appetite and hunger signals.  Denies problems with satiety and satiation.  Denies problems with eating patterns and portion control.  Denies abnormal cravings. Denies feeling deprived or restricted.   Barriers identified: having difficulty preparing healthy meals and low volume of physical activity at present .   Pharmacotherapy for weight loss: She is currently taking Metformin (off label use for incretin effect and / or insulin resistance and / or diabetes prevention) with adequate clinical response  and without side effects..    ASSESSMENT AND PLAN  TREATMENT PLAN FOR OBESITY:  Recommended Dietary Goals  Nicole Robles is currently in the action stage of change. As such, her goal is to continue weight management  plan. She has agreed to: continue current plan  Behavioral Intervention  We discussed the following Behavioral Modification Strategies today: continue to work on maintaining a reduced calorie state, getting the recommended amount of protein, incorporating whole foods, making healthy choices, staying well hydrated and practicing mindfulness when eating..  Additional resources provided today: None  Recommended Physical Activity Goals  Nicole Robles has been advised to work up to 150 minutes of moderate intensity aerobic activity a week and strengthening exercises 2-3 times per week for cardiovascular health, weight loss maintenance and preservation of muscle mass.   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  The obesity treatment plan   Prediabetes Assessment & Plan: Most recent hemoglobin A1c was 6.4 previous value was 6.3.  We are going to increase her metformin to 2 tablets twice daily for pharmacoprophylaxis and incretin effect.  Her husband is on GLP-1 therapy and she does not feel like she could afford another co-pay at that range of $250 a month.  We also discussed increasing protein and reducing simple carbs in her diet.  So far she has lost 26 pounds and is doing a good job.  Orders: -     metFORMIN HCl ER; Take 2 tablets (1,000 mg total) by mouth 2 (two) times daily with a meal.  Dispense: 120 tablet; Refill: 1  OSA (obstructive sleep apnea) Assessment & Plan: Patient reports suboptimal adherence to PAP  therapy.  She cannot tolerate the mask.  She was counseled on the risk associated with untreated sleep apnea.  She is doing a good job with weight loss.  Losing 15% of body weight may reduce AHI.     PHYSICAL EXAM:  Blood pressure 134/84, pulse 63, temperature 98.2 F (36.8 C), height 5' (1.524 m), weight 192 lb (87.1 kg), SpO2 95%. Body mass index is 37.5 kg/m.  General: She is overweight, cooperative, alert, well developed, and in no acute distress. PSYCH: Has normal mood, affect and thought process.   HEENT: EOMI, sclerae are anicteric. Lungs: Normal breathing effort, no conversational dyspnea. Extremities: No edema.  Neurologic: No gross sensory or motor deficits. No tremors or fasciculations noted.    DIAGNOSTIC DATA REVIEWED:  BMET    Component Value Date/Time   NA 142 02/19/2023 0941   K 4.1 02/19/2023 0941   CL 101 02/19/2023 0941   CO2 24 02/19/2023 0941   GLUCOSE 99 02/19/2023 0941   GLUCOSE 107 (H) 03/03/2020 0407   BUN 11 02/19/2023 0941   CREATININE 0.85 02/19/2023 0941   CALCIUM 9.0 02/19/2023 0941   GFRNONAA >60 03/03/2020 0407   GFRAA >60 03/03/2020 0407   Lab Results  Component Value Date   HGBA1C 6.4 (H) 06/16/2023   HGBA1C 6.3 (H) 12/23/2022   Lab Results  Component Value Date   INSULIN 23.8 02/19/2023   Lab Results  Component Value Date   TSH 1.390 06/30/2023   CBC    Component Value Date/Time   WBC 9.0 02/19/2023 0941   WBC 8.3 03/03/2020 0407   RBC 4.54 02/19/2023 0941   RBC 4.45 03/03/2020 0407   HGB 12.7 02/19/2023 0941   HCT 40.5 02/19/2023 0941   PLT 212 02/19/2023 0941   MCV 89 02/19/2023 0941   MCH 28.0 02/19/2023 0941   MCH 28.3 03/03/2020 0407   MCHC 31.4 (L) 02/19/2023 0941   MCHC 32.1 03/03/2020 0407   RDW 12.9 02/19/2023 0941   Iron Studies No results found for: "IRON", "TIBC", "FERRITIN", "IRONPCTSAT" Lipid Panel     Component Value Date/Time   CHOL 160 12/23/2022 1158   TRIG 172 (H) 12/23/2022 1158   HDL 53  12/23/2022 1158   LDLCALC 78 12/23/2022 1158   Hepatic Function Panel     Component Value Date/Time   PROT 6.7 02/19/2023 0941   ALBUMIN 4.1 02/19/2023 0941   AST 29 02/19/2023 0941   ALT 27 02/19/2023 0941   ALKPHOS 82 02/19/2023 0941   BILITOT 0.4 02/19/2023 0941      Component Value Date/Time   TSH 1.390 06/30/2023 1119   Nutritional Lab Results  Component Value Date   VD25OH 54.2 06/16/2023   VD25OH 27.0 (L) 02/19/2023     Return in about 4 weeks (around 08/18/2023) for For Weight Mangement with Dr. Rikki Spearing.Marland Kitchen She was informed of the importance of frequent follow up visits to maximize her success with intensive lifestyle modifications for her multiple health conditions.   ATTESTASTION STATEMENTS:  Reviewed by clinician on day of visit: allergies, medications, problem list, medical history, surgical history, family history, social history, and previous encounter notes.  Worthy Rancher, MD

## 2023-07-21 NOTE — Assessment & Plan Note (Signed)
I reviewed recent CT scan done at [facility] from July.  It showed uncomplicated diverticulitis in the left descending colon and also hepatic steatosis.  She has normal liver enzymes.  Losing 15% of body weight may improve condition.  She continues to work on reducing simple and added sugars in her diet.  She does not drink alcohol or take over-the-counter supplements.  She does not have the funds to afford incretin therapy

## 2023-07-21 NOTE — Assessment & Plan Note (Signed)
Most recent hemoglobin A1c was 6.4 previous value was 6.3.  We are going to increase her metformin to 2 tablets twice daily for pharmacoprophylaxis and incretin effect.  Her husband is on GLP-1 therapy and she does not feel like she could afford another co-pay at that range of $250 a month.  We also discussed increasing protein and reducing simple carbs in her diet.  So far she has lost 26 pounds and is doing a good job.

## 2023-07-21 NOTE — Assessment & Plan Note (Signed)
Patient reports suboptimal adherence to PAP therapy.  She cannot tolerate the mask.  She was counseled on the risk associated with untreated sleep apnea.  She is doing a good job with weight loss.  Losing 15% of body weight may reduce AHI.

## 2023-07-25 ENCOUNTER — Ambulatory Visit: Payer: Self-pay | Admitting: *Deleted

## 2023-07-25 DIAGNOSIS — F419 Anxiety disorder, unspecified: Secondary | ICD-10-CM | POA: Diagnosis not present

## 2023-07-25 DIAGNOSIS — M549 Dorsalgia, unspecified: Secondary | ICD-10-CM | POA: Diagnosis not present

## 2023-07-25 DIAGNOSIS — E039 Hypothyroidism, unspecified: Secondary | ICD-10-CM | POA: Diagnosis not present

## 2023-07-25 DIAGNOSIS — Z9049 Acquired absence of other specified parts of digestive tract: Secondary | ICD-10-CM | POA: Diagnosis not present

## 2023-07-25 DIAGNOSIS — M545 Low back pain, unspecified: Secondary | ICD-10-CM | POA: Diagnosis not present

## 2023-07-25 DIAGNOSIS — Z87891 Personal history of nicotine dependence: Secondary | ICD-10-CM | POA: Diagnosis not present

## 2023-07-25 DIAGNOSIS — I252 Old myocardial infarction: Secondary | ICD-10-CM | POA: Diagnosis not present

## 2023-07-25 DIAGNOSIS — Z90710 Acquired absence of both cervix and uterus: Secondary | ICD-10-CM | POA: Diagnosis not present

## 2023-07-25 DIAGNOSIS — I10 Essential (primary) hypertension: Secondary | ICD-10-CM | POA: Diagnosis not present

## 2023-07-25 DIAGNOSIS — F32A Depression, unspecified: Secondary | ICD-10-CM | POA: Diagnosis not present

## 2023-07-25 DIAGNOSIS — K219 Gastro-esophageal reflux disease without esophagitis: Secondary | ICD-10-CM | POA: Diagnosis not present

## 2023-07-25 NOTE — Telephone Encounter (Signed)
  Chief Complaint: severe back pain worsening and difficulty walking  Symptoms: low to mid back pain needs assist to stand from sitting position, uses walker to walk stayed in bed all day yesterday do to inability to move. Ice pack to back  Frequency: Wednesday  Pertinent Negatives: Patient denies N/T can move legs.  Disposition: [x] ED /[] Urgent Care (no appt availability in office) / [] Appointment(In office/virtual)/ []  [facility]/ [] Home Care/ [] Refused Recommended Disposition /[] [facility]/ []  Follow-up with PCP Additional Notes:   Recommended ED due to severe pain no available appt. Patient concerned she may not be able to get in car. Recommended call 911 for transportation. Patient would like PCP aware of disposition.       Reason for Disposition  [1] SEVERE back pain (e.g., excruciating, unable to do any normal activities) AND [2] not improved 2 hours after pain medicine  Answer Assessment - Initial Assessment Questions 1. ONSET: "When did the pain begin?"      Wednesday morning 2. LOCATION: "Where does it hurt?" (upper, mid or lower back)     Low back to mid back  3. SEVERITY: "How bad is the pain?"  (e.g., Scale 1-10; mild, moderate, or severe)   - MILD (1-3): Doesn't interfere with normal activities.    - MODERATE (4-7): Interferes with normal activities or awakens from sleep.    - SEVERE (8-10): Excruciating pain, unable to do any normal activities.      Worsening pain now has to use walker and needs assist to stand from sitting  4. PATTERN: "Is the pain constant?" (e.g., yes, no; constant, intermittent)      Constant  5. RADIATION: "Does the pain shoot into your legs or somewhere else?"     No  6. CAUSE:  "What do you think is causing the back pain?"      na 7. BACK OVERUSE:  "Any recent lifting of heavy objects, strenuous work or exercise?"     na 8. MEDICINES: "What have you taken so far for the pain?" (e.g., nothing, acetaminophen, NSAIDS)      Ice packs , Voltaren gel not effective 9. NEUROLOGIC SYMPTOMS: "Do you have any weakness, numbness, or problems with bowel/bladder control?"     Na  10. OTHER SYMPTOMS: "Do you have any other symptoms?" (e.g., fever, abdomen pain, burning with urination, blood in urine)       Low to mid back pain , feels like a lot of gas feels bloated  11. PREGNANCY: "Is there any chance you are pregnant?" "When was your last menstrual period?"       na  Protocols used: Back Pain-A-AH

## 2023-08-18 ENCOUNTER — Ambulatory Visit (INDEPENDENT_AMBULATORY_CARE_PROVIDER_SITE_OTHER): Payer: No Typology Code available for payment source | Admitting: Internal Medicine

## 2023-08-19 ENCOUNTER — Encounter (INDEPENDENT_AMBULATORY_CARE_PROVIDER_SITE_OTHER): Payer: Self-pay | Admitting: Internal Medicine

## 2023-08-19 ENCOUNTER — Ambulatory Visit (INDEPENDENT_AMBULATORY_CARE_PROVIDER_SITE_OTHER): Payer: No Typology Code available for payment source | Admitting: Internal Medicine

## 2023-08-19 VITALS — BP 138/82 | HR 61 | Temp 98.1°F | Ht 60.0 in | Wt 191.0 lb

## 2023-08-19 DIAGNOSIS — Z6841 Body Mass Index (BMI) 40.0 and over, adult: Secondary | ICD-10-CM | POA: Diagnosis not present

## 2023-08-19 DIAGNOSIS — E669 Obesity, unspecified: Secondary | ICD-10-CM

## 2023-08-19 DIAGNOSIS — R7303 Prediabetes: Secondary | ICD-10-CM | POA: Diagnosis not present

## 2023-08-19 DIAGNOSIS — G4733 Obstructive sleep apnea (adult) (pediatric): Secondary | ICD-10-CM | POA: Diagnosis not present

## 2023-08-19 DIAGNOSIS — M5430 Sciatica, unspecified side: Secondary | ICD-10-CM | POA: Diagnosis not present

## 2023-08-19 DIAGNOSIS — G8929 Other chronic pain: Secondary | ICD-10-CM

## 2023-08-19 DIAGNOSIS — K760 Fatty (change of) liver, not elsewhere classified: Secondary | ICD-10-CM

## 2023-08-19 NOTE — Progress Notes (Unsigned)
Office: [phone]  /  Fax: [phone]  Weight Summary And Biometrics  Vitals Temp: 98.1 F (36.7 C) BP: 138/82 Pulse Rate: 61 SpO2: 96 %   Anthropometric Measurements Height: 5' (1.524 m) Weight: 191 lb (86.6 kg) BMI (Calculated): 37.3 Weight at Last Visit: 192 lb Weight Lost Since Last Visit: 1 lb Weight Gained Since Last Visit: 0 lb Starting Weight: 215 lb Total Weight Loss (lbs): 24 lb (10.9 kg)   Body Composition  Body Fat %: 49.1 % Fat Mass (lbs): 93.8 lbs Muscle Mass (lbs): 92.2 lbs Total Body Water (lbs): 66 lbs Visceral Fat Rating : 16    No data recorded Today's Visit #: 8  Starting Date: 02/06/23   Subjective   Chief Complaint: Obesity  Nicole Robles is here to discuss her progress with her obesity treatment plan. She is on the the Category 1 Plan and states she is following her eating plan approximately 50 % of the time. She states she is not exercising.  Interval History:   Discussed the use of AI scribe software for clinical note transcription with the patient, who gave verbal consent to proceed.  History of Present Illness   The patient, with a history of obesity, prediabetes, and obstructive sleep apnea, presents for a follow-up visit. She has lost approximately 27 pounds, which is close to 14% of her total body weight.  Recently, the patient experienced severe back pain, which she initially managed with ice packs. However, the pain persisted and escalated to the point where she was unable to stand without the aid of a walker. She sought emergency care and was diagnosed with sciatica. The patient was given a shot and advised to apply heat to the affected area, which has since improved her condition, although some pain persists if she overexerts herself.  The patient reports a decrease in physical activity due to her back pain and the cold weather. She also mentions financial challenges that have led to changes in her diet, possibly increasing her  carbohydrate intake. Despite these challenges, the patient has been able to maintain her weight loss and even lose an additional pound.  The patient's diet consists of a mix of proteins, carbohydrates, and fruits. She has been making efforts to increase her protein intake and choose whole grains over white flour products. She also reports good appetite control and a sense of fullness.  The patient has been non-compliant with her CPAP therapy for obstructive sleep apnea. She acknowledges that untreated sleep apnea can affect her metabolism and hunger hormones, making weight loss more challenging. The patient is aiming to lose 15% of her body weight, which would bring her down to around 185 pounds.      Orexigenic Control:  Denies problems with appetite and hunger signals.  Denies problems with satiety and satiation.  Denies problems with eating patterns and portion control.  Denies abnormal cravings. Denies feeling deprived or restricted.   Barriers identified: low volume of physical activity at present , orthopedic problems, medical conditions or chronic pain affecting mobility, and sleep apnea.   Pharmacotherapy for weight loss: She is currently taking Metformin (off label use for incretin effect and / or insulin resistance and / or diabetes prevention) with adequate clinical response  and without side effects..   Assessment and Plan   Treatment Plan For Obesity:  Recommended Dietary Goals  Zoeyann is currently in the action stage of change. As such, her goal is to continue weight management plan. She has agreed to: continue current  plan  Behavioral Intervention  We discussed the following Behavioral Modification Strategies today: continue to work on maintaining a reduced calorie state, getting the recommended amount of protein, incorporating whole foods, making healthy choices, staying well hydrated and practicing mindfulness when eating..  Additional resources provided today:  None  Recommended Physical Activity Goals  Jetzabel has been advised to work up to 150 minutes of moderate intensity aerobic activity a week and strengthening exercises 2-3 times per week for cardiovascular health, weight loss maintenance and preservation of muscle mass.   She has agreed to :  Think about enjoyable ways to increase daily physical activity and overcoming barriers to exercise and Increase physical activity in their day and reduce sedentary time (increase NEAT).  Pharmacotherapy  We discussed various medication options to help Markeshia with her weight loss efforts and we both agreed to : continue with nutritional and behavioral strategies  Associated Conditions Addressed Today  Assessment and Plan    Obesity   She has lost approximately 27 pounds, which is 14% of her total body weight. The current weight loss pace has slowed, likely due to an increased intake of carbohydrates and decreased physical activity, attributed to back pain and colder weather. We discussed increasing protein intake to boost metabolism and opting for whole grains instead of refined carbohydrates. Continuing to lose weight could improve her sleep apnea symptoms. We will increase her protein intake, choose whole grains over refined carbohydrates, continue physical activity as tolerated, and follow up in 4 weeks.  Obstructive Sleep Apnea   Her adherence to CPAP therapy is suboptimal. Untreated sleep apnea can affect metabolism and hunger hormones, complicating weight loss efforts. Continued weight loss could improve these symptoms. There might be a need for a repeat sleep study with her PCP if significant weight loss is achieved. We will encourage adherence to CPAP therapy, continue weight loss efforts, and discuss the potential need for a repeat sleep study with her PCP.  Prediabetes   Her Hemoglobin A1c has increased from 6.3 to 6.4. We increased Metformin to two tablets twice a day at the last visit, and  she has reported no side effects. We discussed the impact of carbohydrate intake on blood sugar levels and the importance of a balanced diet. We will continue Metformin at two tablets twice a day, monitor carbohydrate intake, and follow up in 4 weeks.  Nonalcoholic Fatty Liver Disease (NAFLD)   We discussed the importance of a low-sugar diet and avoiding saturated fats to prevent further liver fat accumulation. We emphasized choosing lean meats such as fish, chicken, and [place]. She will maintain a low-sugar diet, avoid saturated fats, and choose lean meats such as fish, chicken, and [place].  Sciatica   She recently experienced severe back pain leading to an ER visit, where she received a shot and was advised to use heat therapy, which provided some relief. The pain persists with certain activities. We advised avoiding activities that exacerbate the pain and to gradually increase physical activity as tolerated. She will continue heat therapy as needed, avoid activities that exacerbate pain, and gradually increase physical activity as tolerated.  General Health Maintenance   We discussed dietary modifications and physical activity and provided a handout on staying on track during the holidays. She will receive a handout on holiday eating tips, encourage regular physical activity, and monitor dietary intake.  Follow-up   We will follow up in 4 weeks.             Objective  Physical Exam:  Blood pressure 138/82, pulse 61, temperature 98.1 F (36.7 C), height 5' (1.524 m), weight 191 lb (86.6 kg), SpO2 96%. Body mass index is 37.3 kg/m.  General: She is overweight, cooperative, alert, well developed, and in no acute distress. PSYCH: Has normal mood, affect and thought process.   HEENT: EOMI, sclerae are anicteric. Lungs: Normal breathing effort, no conversational dyspnea. Extremities: No edema.  Neurologic: No gross sensory or motor deficits. No tremors or fasciculations noted.     Diagnostic Data Reviewed:  BMET    Component Value Date/Time   NA 142 02/19/2023 0941   K 4.1 02/19/2023 0941   CL 101 02/19/2023 0941   CO2 24 02/19/2023 0941   GLUCOSE 99 02/19/2023 0941   GLUCOSE 107 (H) 03/03/2020 0407   BUN 11 02/19/2023 0941   CREATININE 0.85 02/19/2023 0941   CALCIUM 9.0 02/19/2023 0941   GFRNONAA >60 03/03/2020 0407   GFRAA >60 03/03/2020 0407   Lab Results  Component Value Date   HGBA1C 6.4 (H) 06/16/2023   HGBA1C 6.3 (H) 12/23/2022   Lab Results  Component Value Date   INSULIN 23.8 02/19/2023   Lab Results  Component Value Date   TSH 1.390 06/30/2023   CBC    Component Value Date/Time   WBC 9.0 02/19/2023 0941   WBC 8.3 03/03/2020 0407   RBC 4.54 02/19/2023 0941   RBC 4.45 03/03/2020 0407   HGB 12.7 02/19/2023 0941   HCT 40.5 02/19/2023 0941   PLT 212 02/19/2023 0941   MCV 89 02/19/2023 0941   MCH 28.0 02/19/2023 0941   MCH 28.3 03/03/2020 0407   MCHC 31.4 (L) 02/19/2023 0941   MCHC 32.1 03/03/2020 0407   RDW 12.9 02/19/2023 0941   Iron Studies No results found for: "IRON", "TIBC", "FERRITIN", "IRONPCTSAT" Lipid Panel     Component Value Date/Time   CHOL 160 12/23/2022 1158   TRIG 172 (H) 12/23/2022 1158   HDL 53 12/23/2022 1158   LDLCALC 78 12/23/2022 1158   Hepatic Function Panel     Component Value Date/Time   PROT 6.7 02/19/2023 0941   ALBUMIN 4.1 02/19/2023 0941   AST 29 02/19/2023 0941   ALT 27 02/19/2023 0941   ALKPHOS 82 02/19/2023 0941   BILITOT 0.4 02/19/2023 0941      Component Value Date/Time   TSH 1.390 06/30/2023 1119   Nutritional Lab Results  Component Value Date   VD25OH 54.2 06/16/2023   VD25OH 27.0 (L) 02/19/2023    Follow-Up   Return in about 4 weeks (around 09/16/2023) for For Weight Mangement with Dr. Rikki Spearing.Marland Kitchen She was informed of the importance of frequent follow up visits to maximize her success with intensive lifestyle modifications for her multiple health  conditions.  Attestation Statement   Reviewed by clinician on day of visit: allergies, medications, problem list, medical history, surgical history, family history, social history, and previous encounter notes.     Worthy Rancher, MD

## 2023-08-20 DIAGNOSIS — G8929 Other chronic pain: Secondary | ICD-10-CM | POA: Insufficient documentation

## 2023-09-17 ENCOUNTER — Ambulatory Visit (INDEPENDENT_AMBULATORY_CARE_PROVIDER_SITE_OTHER): Payer: No Typology Code available for payment source | Admitting: Internal Medicine

## 2023-09-17 ENCOUNTER — Encounter (INDEPENDENT_AMBULATORY_CARE_PROVIDER_SITE_OTHER): Payer: Self-pay

## 2023-10-23 ENCOUNTER — Other Ambulatory Visit (INDEPENDENT_AMBULATORY_CARE_PROVIDER_SITE_OTHER): Payer: Self-pay | Admitting: Internal Medicine

## 2023-10-23 DIAGNOSIS — R7303 Prediabetes: Secondary | ICD-10-CM

## 2023-11-10 ENCOUNTER — Other Ambulatory Visit: Payer: Self-pay | Admitting: Family Medicine

## 2023-11-10 DIAGNOSIS — Z8669 Personal history of other diseases of the nervous system and sense organs: Secondary | ICD-10-CM

## 2023-11-12 ENCOUNTER — Other Ambulatory Visit (INDEPENDENT_AMBULATORY_CARE_PROVIDER_SITE_OTHER): Payer: Self-pay | Admitting: Internal Medicine

## 2023-11-12 ENCOUNTER — Other Ambulatory Visit (INDEPENDENT_AMBULATORY_CARE_PROVIDER_SITE_OTHER): Payer: Self-pay

## 2023-11-12 DIAGNOSIS — R7303 Prediabetes: Secondary | ICD-10-CM

## 2023-11-12 MED ORDER — METFORMIN HCL ER 500 MG PO TB24: 1000.0000 mg | ORAL_TABLET | Freq: Two times a day (BID) | ORAL | 1 refills | Status: DC

## 2023-12-10 ENCOUNTER — Encounter (INDEPENDENT_AMBULATORY_CARE_PROVIDER_SITE_OTHER): Payer: Self-pay | Admitting: Internal Medicine

## 2023-12-10 ENCOUNTER — Ambulatory Visit (INDEPENDENT_AMBULATORY_CARE_PROVIDER_SITE_OTHER): Payer: No Typology Code available for payment source | Admitting: Internal Medicine

## 2023-12-10 VITALS — BP 123/76 | HR 63 | Temp 97.7°F | Ht 60.0 in | Wt 188.0 lb

## 2023-12-10 DIAGNOSIS — E66813 Obesity, class 3: Secondary | ICD-10-CM

## 2023-12-10 DIAGNOSIS — K760 Fatty (change of) liver, not elsewhere classified: Secondary | ICD-10-CM | POA: Diagnosis not present

## 2023-12-10 DIAGNOSIS — Z6841 Body Mass Index (BMI) 40.0 and over, adult: Secondary | ICD-10-CM | POA: Diagnosis not present

## 2023-12-10 DIAGNOSIS — R7303 Prediabetes: Secondary | ICD-10-CM | POA: Diagnosis not present

## 2023-12-10 MED ORDER — METFORMIN HCL ER 500 MG PO TB24: 1000.0000 mg | ORAL_TABLET | Freq: Two times a day (BID) | ORAL | 2 refills | Status: AC

## 2023-12-10 NOTE — Assessment & Plan Note (Signed)
 Detected on CT July 2024.  She has normal liver enzymes.  Losing 15% of body weight may improve condition.  She continues to work on reducing simple and added sugars in her diet.  She does not drink alcohol or take over-the-counter supplements.  GLP-1 treatment is cost prohibitive

## 2023-12-10 NOTE — Progress Notes (Signed)
 Office: [phone]  /  Fax: [phone]  Weight Summary And Biometrics  Vitals Temp: 97.7 F (36.5 C) BP: 123/76 Pulse Rate: 63 SpO2: 95 %   Anthropometric Measurements Height: 5' (1.524 m) Weight: 188 lb (85.3 kg) BMI (Calculated): 36.72 Weight at Last Visit: 191 lb Weight Lost Since Last Visit: 3 lb Weight Gained Since Last Visit: 0 Starting Weight: 215 lb Total Weight Loss (lbs): 27 lb (12.2 kg) Peak Weight: 225 lb   Body Composition  Body Fat %: 48.5 % Fat Mass (lbs): 91.2 lbs Muscle Mass (lbs): 91.8 lbs Total Body Water (lbs): 65.8 lbs Visceral Fat Rating : 16    No data recorded Today's Visit #: 9  Starting Date: 02/19/23   Subjective   Chief Complaint: Obesity  Interval History Discussed the use of AI scribe software for clinical note transcription with the patient, who gave verbal consent to proceed.  History of Present Illness   Nicole Robles is a 73 year old female who presents for medical weight management.  She has lost three pounds since her last visit. She follows the category one plan about fifty percent of the time and is incorporating more whole foods into her diet. She is unsure about her protein intake and feels she is not getting enough water, but does not skip meals.  Her highest recorded weight was 225 pounds, and she has lost a total of 30 pounds over the past year. Her weight loss has been gradual, with fluctuations, but she is pleased with the overall progress.  Her physical activity includes doing housework and walking for about twenty minutes seven days a week. She gets seven to nine hours of sleep and is not experiencing high levels of stress.  She is currently taking metformin, two tablets twice a day, and has no issues with the medication. She also takes a hair and nail vitamin and vitamin D3.     Challenges affecting patient progress: none.    Pharmacotherapy for weight management: She is currently taking  Metformin (off label use for incretin effect and / or insulin resistance and / or diabetes prevention) with adequate clinical response  and without side effects..   Assessment and Plan   Treatment Plan For Obesity:  Recommended Dietary Goals  Nicole Robles is currently in the action stage of change. As such, her goal is to continue weight management plan. She has agreed to: continue current plan  Behavioral Health and Counseling  We discussed the following behavioral modification strategies today: continue to work on maintaining a reduced calorie state, getting the recommended amount of protein, incorporating whole foods, making healthy choices, staying well hydrated and practicing mindfulness when eating..  Additional education and resources provided today: None  Recommended Physical Activity Goals  Nicole Robles has been advised to work up to 150 minutes of moderate intensity aerobic activity a week and strengthening exercises 2-3 times per week for cardiovascular health, weight loss maintenance and preservation of muscle mass.   She has agreed to :  Think about enjoyable ways to increase daily physical activity and overcoming barriers to exercise and Increase physical activity in their day and reduce sedentary time (increase NEAT).  Pharmacotherapy  We discussed various medication options to help Nicole Robles with her weight loss efforts and we both agreed to : adequate clinical response to current dose, continue current regimen  Associated Conditions Impacted by Obesity Treatment  Hepatic steatosis Assessment & Plan: Detected on CT July 2024.  She has normal liver enzymes.  Losing 15%  of body weight may improve condition.  She continues to work on reducing simple and added sugars in her diet.  She does not drink alcohol or take over-the-counter supplements.  GLP-1 treatment is cost prohibitive     Prediabetes Assessment & Plan: Her last A1c was 6.4 she is due for disease monitoring labs  and will be having these at her PCPs office. - Continue metformin, two tablets twice a day -Check B12 with next set of labs as she is on metformin. - Refill metformin prescription for 90 days   Orders: -     metFORMIN HCl ER; Take 2 tablets (1,000 mg total) by mouth 2 (two) times daily with a meal.  Dispense: 120 tablet; Refill: 2  Class 3 severe obesity with serious comorbidity and body mass index (BMI) of 40.0 to 44.9 in adult, unspecified obesity type [facility]) Assessment & Plan: She has lost 30 pounds over the past year, including 3 pounds since the last visit, by following the category one plan about 50% of the time. She incorporates more whole foods into her diet and engages in regular physical activity, including housework and walking. She is not skipping meals and is getting adequate sleep. She is aware of the need to increase her protein intake and hydration. Emphasized the importance of maintaining a calorie deficit, balanced diet, and portion control. Encouraged to continue her current efforts and to increase her physical activity as the weather improves. - Continue current weight management plan - Encourage increased physical activity as weather improves - Emphasize importance of balanced diet and portion control - Encourage adequate protein intake and hydration -Continue metformin for diabetes prevention and weight management       General Health Maintenance She is taking a multivitamin with minerals and vitamin D3, as well as a hair and nail vitamin. Discussed the importance of a balanced diet, regular exercise, and adequate hydration for overall health maintenance. - Continue multivitamin with minerals and vitamin D3 - Encourage balanced diet and regular exercise - Ensure adequate hydration          Objective   Physical Exam:  Blood pressure 123/76, pulse 63, temperature 97.7 F (36.5 C), height 5' (1.524 m), weight 188 lb (85.3 kg), SpO2 95%. Body mass index is 36.72  kg/m.  General: She is overweight, cooperative, alert, well developed, and in no acute distress. PSYCH: Has normal mood, affect and thought process.   HEENT: EOMI, sclerae are anicteric. Lungs: Normal breathing effort, no conversational dyspnea. Extremities: No edema.  Neurologic: No gross sensory or motor deficits. No tremors or fasciculations noted.    Diagnostic Data Reviewed:  BMET    Component Value Date/Time   NA 142 02/19/2023 0941   K 4.1 02/19/2023 0941   CL 101 02/19/2023 0941   CO2 24 02/19/2023 0941   GLUCOSE 99 02/19/2023 0941   GLUCOSE 107 (H) 03/03/2020 0407   BUN 11 02/19/2023 0941   CREATININE 0.85 02/19/2023 0941   CALCIUM 9.0 02/19/2023 0941   GFRNONAA >60 03/03/2020 0407   GFRAA >60 03/03/2020 0407   Lab Results  Component Value Date   HGBA1C 6.4 (H) 06/16/2023   HGBA1C 6.3 (H) 12/23/2022   Lab Results  Component Value Date   INSULIN 23.8 02/19/2023   Lab Results  Component Value Date   TSH 1.390 06/30/2023   CBC    Component Value Date/Time   WBC 9.0 02/19/2023 0941   WBC 8.3 03/03/2020 0407   RBC 4.54 02/19/2023 0941   RBC  4.45 03/03/2020 0407   HGB 12.7 02/19/2023 0941   HCT 40.5 02/19/2023 0941   PLT 212 02/19/2023 0941   MCV 89 02/19/2023 0941   MCH 28.0 02/19/2023 0941   MCH 28.3 03/03/2020 0407   MCHC 31.4 (L) 02/19/2023 0941   MCHC 32.1 03/03/2020 0407   RDW 12.9 02/19/2023 0941   Iron Studies No results found for: "IRON", "TIBC", "FERRITIN", "IRONPCTSAT" Lipid Panel     Component Value Date/Time   CHOL 160 12/23/2022 1158   TRIG 172 (H) 12/23/2022 1158   HDL 53 12/23/2022 1158   LDLCALC 78 12/23/2022 1158   Hepatic Function Panel     Component Value Date/Time   PROT 6.7 02/19/2023 0941   ALBUMIN 4.1 02/19/2023 0941   AST 29 02/19/2023 0941   ALT 27 02/19/2023 0941   ALKPHOS 82 02/19/2023 0941   BILITOT 0.4 02/19/2023 0941      Component Value Date/Time   TSH 1.390 06/30/2023 1119   Nutritional Lab Results   Component Value Date   VD25OH 54.2 06/16/2023   VD25OH 27.0 (L) 02/19/2023    Medications: Outpatient Encounter Medications as of 12/10/2023  Medication Sig   albuterol (VENTOLIN HFA) 108 (90 Base) MCG/ACT inhaler INHALE 2 TO 4 PUFFS BY MOUTH EVERY 4 HOURS AS NEEDED FOR WHEEZING OR COUGH OR SHORTNESS OF BREATH   aspirin EC 81 MG tablet Take 81 mg by mouth daily.   levothyroxine (SYNTHROID) 88 MCG tablet Take 1 tablet (88 mcg total) by mouth daily before breakfast.   metoprolol tartrate (LOPRESSOR) 50 MG tablet Take 50 mg by mouth daily. cardio   Multiple Vitamin (MULTIVITAMIN) capsule Take 1 capsule by mouth daily. Hair and skin   omeprazole (PRILOSEC) 20 MG capsule Take 20 mg by mouth 2 (two) times daily before a meal. GI   pravastatin (PRAVACHOL) 10 MG tablet Take 1 tablet (10 mg total) by mouth daily.   sertraline (ZOLOFT) 50 MG tablet Take 1 tablet (50 mg total) by mouth daily.   SUMAtriptan (IMITREX) 100 MG tablet TAKE 1 TABLET(100 MG) BY MOUTH 1 TIME FOR 1 DOSE. REPEAT IN 2 HOURS ONCE IF CONTINUE HEADACHE   [DISCONTINUED] metFORMIN (GLUCOPHAGE-XR) 500 MG 24 hr tablet Take 2 tablets (1,000 mg total) by mouth 2 (two) times daily with a meal.   metFORMIN (GLUCOPHAGE-XR) 500 MG 24 hr tablet Take 2 tablets (1,000 mg total) by mouth 2 (two) times daily with a meal.   No facility-administered encounter medications on file as of 12/10/2023.     Follow-Up   Return in about 4 weeks (around 01/07/2024) for For Weight Mangement with Dr. Rikki Spearing.Marland Kitchen She was informed of the importance of frequent follow up visits to maximize her success with intensive lifestyle modifications for her multiple health conditions.  Attestation Statement   Reviewed by clinician on day of visit: allergies, medications, problem list, medical history, surgical history, family history, social history, and previous encounter notes.     Worthy Rancher, MD

## 2023-12-10 NOTE — Assessment & Plan Note (Signed)
 She has lost 30 pounds over the past year, including 3 pounds since the last visit, by following the category one plan about 50% of the time. She incorporates more whole foods into her diet and engages in regular physical activity, including housework and walking. She is not skipping meals and is getting adequate sleep. She is aware of the need to increase her protein intake and hydration. Emphasized the importance of maintaining a calorie deficit, balanced diet, and portion control. Encouraged to continue her current efforts and to increase her physical activity as the weather improves. - Continue current weight management plan - Encourage increased physical activity as weather improves - Emphasize importance of balanced diet and portion control - Encourage adequate protein intake and hydration -Continue metformin for diabetes prevention and weight management

## 2023-12-10 NOTE — Assessment & Plan Note (Signed)
 Her last A1c was 6.4 she is due for disease monitoring labs and will be having these at her PCPs office. - Continue metformin, two tablets twice a day -Check B12 with next set of labs as she is on metformin. - Refill metformin prescription for 90 days

## 2023-12-11 ENCOUNTER — Encounter: Payer: Self-pay | Admitting: Family Medicine

## 2023-12-11 ENCOUNTER — Ambulatory Visit (INDEPENDENT_AMBULATORY_CARE_PROVIDER_SITE_OTHER): Payer: Self-pay | Admitting: Family Medicine

## 2023-12-11 VITALS — BP 134/66 | HR 75 | Ht 60.0 in | Wt 192.0 lb

## 2023-12-11 DIAGNOSIS — R7303 Prediabetes: Secondary | ICD-10-CM

## 2023-12-11 DIAGNOSIS — E782 Mixed hyperlipidemia: Secondary | ICD-10-CM

## 2023-12-11 DIAGNOSIS — E034 Atrophy of thyroid (acquired): Secondary | ICD-10-CM | POA: Diagnosis not present

## 2023-12-11 DIAGNOSIS — F3341 Major depressive disorder, recurrent, in partial remission: Secondary | ICD-10-CM

## 2023-12-11 DIAGNOSIS — F5101 Primary insomnia: Secondary | ICD-10-CM

## 2023-12-11 MED ORDER — LEVOTHYROXINE SODIUM 88 MCG PO TABS
88.0000 ug | ORAL_TABLET | Freq: Every day | ORAL | 1 refills | Status: AC
Start: 2023-12-11 — End: ?

## 2023-12-11 MED ORDER — SERTRALINE HCL 50 MG PO TABS: 50.0000 mg | ORAL_TABLET | Freq: Every day | ORAL | 1 refills | Status: AC

## 2023-12-11 MED ORDER — PRAVASTATIN SODIUM 10 MG PO TABS: 10.0000 mg | ORAL_TABLET | Freq: Every day | ORAL | 1 refills | Status: AC

## 2023-12-11 NOTE — Progress Notes (Signed)
 Date:  12/11/2023   Name:  Nicole Robles   DOB:  May 05, 1951   MRN:  [id number]   Chief Complaint: Hypothyroidism, Depression, Hyperlipidemia, and Insomnia  Depression        This is a chronic problem.  The current episode started more than 1 year ago.   The problem occurs rarely.  The problem has been gradually improving since onset.  Associated symptoms include insomnia.  Associated symptoms include no decreased concentration, no fatigue, no helplessness, no hopelessness, not irritable, no restlessness, no decreased interest, no appetite change, no body aches, no myalgias, no headaches, no indigestion, not sad and no suicidal ideas.     The symptoms are aggravated by social issues.  Past treatments include SSRIs - Selective serotonin reuptake inhibitors.  Compliance with treatment is good.  Previous treatment provided moderate relief.  Past medical history includes thyroid problem.     Pertinent negatives include no hypothyroidism. Hyperlipidemia This is a chronic problem. The current episode started more than 1 year ago. The problem is controlled. Recent lipid tests were reviewed and are normal. She has no history of chronic renal disease, diabetes, hypothyroidism, liver disease, obesity or nephrotic syndrome. Pertinent negatives include no chest pain, focal sensory loss, focal weakness, leg pain, myalgias or shortness of breath. The current treatment provides moderate improvement of lipids. There are no compliance problems.   Insomnia Primary symptoms: no fragmented sleep, no difficulty falling asleep.   The onset quality is gradual. The problem has been gradually improving since onset. The symptoms are relieved by medication. Past treatments include medication. The treatment provided moderate relief. PMH includes: depression.   Thyroid Problem Presents for follow-up visit. Symptoms include weight loss. Patient reports no anxiety, cold intolerance, constipation, depressed mood, diaphoresis,  diarrhea, dry skin, fatigue, hair loss, heat intolerance, hoarse voice, leg swelling, menstrual problem, nail problem, palpitations, tremors or visual change. The symptoms have been stable. Her past medical history is significant for hyperlipidemia. There is no history of diabetes.  Diabetes She presents for her follow-up diabetic visit. Diabetes type: prediabetes. There are no hypoglycemic associated symptoms. Pertinent negatives for hypoglycemia include no dizziness, headaches, nervousness/anxiousness, seizures, sleepiness, sweats or tremors. Associated symptoms include weight loss. Pertinent negatives for diabetes include no chest pain, no fatigue and no visual change. There are no hypoglycemic complications. Symptoms are stable. There are no diabetic complications. Current diabetic treatment includes oral agent (monotherapy).    Lab Results  Component Value Date   NA 142 02/19/2023   K 4.1 02/19/2023   CO2 24 02/19/2023   GLUCOSE 99 02/19/2023   BUN 11 02/19/2023   CREATININE 0.85 02/19/2023   CALCIUM 9.0 02/19/2023   EGFR 72 02/19/2023   GFRNONAA >60 03/03/2020   Lab Results  Component Value Date   CHOL 160 12/23/2022   HDL 53 12/23/2022   LDLCALC 78 12/23/2022   TRIG 172 (H) 12/23/2022   Lab Results  Component Value Date   TSH 1.390 06/30/2023   Lab Results  Component Value Date   HGBA1C 6.4 (H) 06/16/2023   Lab Results  Component Value Date   WBC 9.0 02/19/2023   HGB 12.7 02/19/2023   HCT 40.5 02/19/2023   MCV 89 02/19/2023   PLT 212 02/19/2023   Lab Results  Component Value Date   ALT 27 02/19/2023   AST 29 02/19/2023   ALKPHOS 82 02/19/2023   BILITOT 0.4 02/19/2023   Lab Results  Component Value Date   VD25OH 54.2 06/16/2023  Review of Systems  Constitutional:  Positive for weight loss. Negative for appetite change, diaphoresis and fatigue.  HENT:  Negative for hoarse voice.   Respiratory:  Negative for shortness of breath.   Cardiovascular:   Negative for chest pain and palpitations.  Gastrointestinal:  Negative for constipation and diarrhea.  Endocrine: Negative for cold intolerance and heat intolerance.  Genitourinary:  Negative for menstrual problem.  Musculoskeletal:  Negative for myalgias.  Neurological:  Negative for dizziness, tremors, focal weakness, seizures and headaches.  Psychiatric/Behavioral:  Positive for depression. Negative for decreased concentration and suicidal ideas. The patient has insomnia. The patient is not nervous/anxious.     Patient Active Problem List   Diagnosis Date Noted   Chronic right-sided low back pain with right-sided sciatica 08/20/2023   Special screening for malignant neoplasms, colon 07/04/2023   Hepatic steatosis 05/21/2023   Diverticulitis 05/21/2023   Vitamin D deficiency 03/05/2023   OSA (obstructive sleep apnea) 02/06/2023   Class 3 severe obesity with serious comorbidity and body mass index (BMI) of 40.0 to 44.9 in adult [facility]) 02/06/2023   Prediabetes 06/17/2022   Asthmatic bronchitis , chronic (HCC) 12/19/2020   Hyperlipidemia, mixed 08/04/2018   Hypothyroidism 07/28/2018   Recurrent major depressive disorder, in partial remission (HCC) 07/28/2018    Allergies  Allergen Reactions   Amoxicillin-Pot Clavulanate Hives and Rash   Oxycodone-Acetaminophen    Codeine Nausea And Vomiting   Meperidine Nausea And Vomiting   Meperidine Hcl Nausea And Vomiting   Morphine Nausea And Vomiting   Propoxyphene Nausea And Vomiting and Rash    Past Surgical History:  Procedure Laterality Date   ABDOMINAL HYSTERECTOMY     CHOLECYSTECTOMY     COLONOSCOPY WITH PROPOFOL N/A 07/04/2023   Procedure: COLONOSCOPY WITH PROPOFOL;  Surgeon: Midge Minium, MD;  Location: [facility];  Service: Endoscopy;  Laterality: N/A;   heart ablation  07/30/2014    Social History   Tobacco Use   Smoking status: Former    Current packs/day: 0.00    Average packs/day: 1.5 packs/day for 20.0 years  (30.0 ttl pk-yrs)    Types: Cigarettes    Start date: 10/07/1976    Quit date: 10/07/1996    Years since quitting: 27.1   Smokeless tobacco: Never  Vaping Use   Vaping status: Never Used  Substance Use Topics   Alcohol use: Never   Drug use: Never     Medication list has been reviewed and updated.  Current Meds  Medication Sig   metoprolol succinate (TOPROL-XL) 100 MG 24 hr tablet Take 100 mg by mouth daily.       12/11/2023    2:05 PM 06/30/2023   10:11 AM 05/12/2023    3:26 PM 12/23/2022   10:57 AM  GAD 7 : Generalized Anxiety Score  Nervous, Anxious, on Edge 0 0 0 0  Control/stop worrying 0 0 0 0  Worry too much - different things 0 0 0 0  Trouble relaxing 0 0 0 0  Restless 0 0 0 0  Easily annoyed or irritable 0 0 0 0  Afraid - awful might happen 0 0 0 0  Total GAD 7 Score 0 0 0 0  Anxiety Difficulty Not difficult at all Not difficult at all Not difficult at all Not difficult at all       12/11/2023    2:05 PM 06/30/2023   10:11 AM 05/12/2023    3:26 PM  Depression screen PHQ 2/9  Decreased Interest 0 0 0  Down, Depressed, Hopeless 0 0 0  PHQ - 2 Score 0 0 0  Altered sleeping 1 0 0  Tired, decreased energy 0 0 0  Change in appetite 0 0 0  Feeling bad or failure about yourself  0 0 0  Trouble concentrating 0 0 0  Moving slowly or fidgety/restless 0 0 0  Suicidal thoughts 0 0 0  PHQ-9 Score 1 0 0  Difficult doing work/chores Not difficult at all Not difficult at all Not difficult at all    BP Readings from Last 3 Encounters:  12/11/23 134/66  12/10/23 123/76  08/19/23 138/82    Physical Exam Vitals and nursing note reviewed.  Constitutional:      General: She is not irritable.She is not in acute distress.    Appearance: She is not diaphoretic.  HENT:     Head: Normocephalic and atraumatic.     Right Ear: Tympanic membrane and external ear normal.     Left Ear: Tympanic membrane and external ear normal.     Nose: Nose normal. No congestion or rhinorrhea.      Mouth/Throat:     Pharynx: Oropharynx is clear.  Eyes:     General:        Right eye: No discharge.        Left eye: No discharge.     Conjunctiva/sclera: Conjunctivae normal.     Pupils: Pupils are equal, round, and reactive to light.  Neck:     Thyroid: No thyromegaly.     Vascular: No JVD.  Cardiovascular:     Rate and Rhythm: Normal rate and regular rhythm.     Heart sounds: Normal heart sounds. No murmur heard.    No friction rub. No gallop.  Pulmonary:     Effort: Pulmonary effort is normal.     Breath sounds: Normal breath sounds.  Abdominal:     General: Bowel sounds are normal.     Palpations: Abdomen is soft. There is no mass.     Tenderness: There is no abdominal tenderness. There is no guarding.  Musculoskeletal:        General: Normal range of motion.     Cervical back: Normal range of motion and neck supple.  Lymphadenopathy:     Cervical: No cervical adenopathy.  Skin:    General: Skin is warm and dry.  Neurological:     Mental Status: She is alert.     Deep Tendon Reflexes: Reflexes are normal and symmetric.     Wt Readings from Last 3 Encounters:  12/11/23 192 lb (87.1 kg)  12/10/23 188 lb (85.3 kg)  08/19/23 191 lb (86.6 kg)    BP 134/66   Pulse 75   Ht 5' (1.524 m)   Wt 192 lb (87.1 kg)   SpO2 96%   BMI 37.50 kg/m   Assessment and Plan: 1. 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  Controlled.  Controlled.  Will continue levothyroxine 88 mcg which she is tolerating well.  We will hold on TSH and that it has not been since the last. - levothyroxine (SYNTHROID) 88 MCG tablet; Take 1 tablet (88 mcg total) by mouth daily before breakfast.  Dispense: 90 tablet; Refill: 1  2. Hyperlipidemia, mixed (Primary) Chronic.  Controlled.  Stable.  Asymptomatic continue the pravastatin 10 mg once a day.  Check lipid panel for LDL control and CMP for hepatic concerns. - pravastatin (PRAVACHOL) 10 MG tablet; Take 1 tablet (10 mg total) by  mouth daily.  Dispense: 90 tablet; Refill: 1 - Lipid Panel With LDL/HDL Ratio - Comprehensive metabolic panel  3. Recurrent major depressive disorder, in partial remission (HCC) Chronic.  Controlled.  Stable.  Asymptomatic.  With PHQ of 1 due to episodic insomnia.  Continue sertraline 50 mg once a day.  Will recheck patient in 6 months. - sertraline (ZOLOFT) 50 MG tablet; Take 1 tablet (50 mg total) by mouth daily.  Dispense: 90 tablet; Refill: 1  4. Prediabetes Doing well with weight loss by design.  Patient will continue with current regimen and we will recheck A1c to see if she is remaining in diabetic/prediabetic range. - Hemoglobin A1c  5. Primary insomnia Chronic.  Episodic.  Stable.  Only occasionally has to take what sounds like her trazodone.  This is not on a nightly basis and she does not need refills at this time.  Elizabeth Sauer, MD

## 2023-12-12 ENCOUNTER — Encounter: Payer: Self-pay | Admitting: Family Medicine

## 2023-12-12 LAB — COMPREHENSIVE METABOLIC PANEL
ALT: 16 IU/L (ref 0–32)
AST: 22 IU/L (ref 0–40)
Albumin: 4 g/dL (ref 3.8–4.8)
Alkaline Phosphatase: 88 IU/L (ref 44–121)
BUN/Creatinine Ratio: 10 — ABNORMAL LOW (ref 12–28)
BUN: 8 mg/dL (ref 8–27)
Bilirubin Total: 0.2 mg/dL (ref 0.0–1.2)
CO2: 22 mmol/L (ref 20–29)
Calcium: 9 mg/dL (ref 8.7–10.3)
Chloride: 100 mmol/L (ref 96–106)
Creatinine, Ser: 0.77 mg/dL (ref 0.57–1.00)
Globulin, Total: 3 g/dL (ref 1.5–4.5)
Glucose: 103 mg/dL — ABNORMAL HIGH (ref 70–99)
Potassium: 4.5 mmol/L (ref 3.5–5.2)
Sodium: 138 mmol/L (ref 134–144)
Total Protein: 7 g/dL (ref 6.0–8.5)
eGFR: 81 mL/min/{1.73_m2} (ref >59)

## 2023-12-12 LAB — HEMOGLOBIN A1C
Est. average glucose Bld gHb Est-mCnc: 128 mg/dL
Hgb A1c MFr Bld: 6.1 % — ABNORMAL HIGH (ref 4.8–5.6)

## 2023-12-12 LAB — LIPID PANEL WITH LDL/HDL RATIO
Cholesterol, Total: 176 mg/dL (ref 100–199)
HDL: 53 mg/dL (ref >39)
LDL Chol Calc (NIH): 77 mg/dL (ref 0–99)
LDL/HDL Ratio: 1.5 ratio (ref 0.0–3.2)
Triglycerides: 284 mg/dL — ABNORMAL HIGH (ref 0–149)
VLDL Cholesterol Cal: 46 mg/dL — ABNORMAL HIGH (ref 5–40)

## 2023-12-22 ENCOUNTER — Encounter: Payer: Self-pay | Admitting: Family Medicine

## 2023-12-22 ENCOUNTER — Ambulatory Visit (INDEPENDENT_AMBULATORY_CARE_PROVIDER_SITE_OTHER): Admitting: Family Medicine

## 2023-12-22 VITALS — BP 124/78 | HR 85 | Ht 60.0 in | Wt 190.0 lb

## 2023-12-22 DIAGNOSIS — J0100 Acute maxillary sinusitis, unspecified: Secondary | ICD-10-CM | POA: Diagnosis not present

## 2023-12-22 MED ORDER — AZITHROMYCIN 250 MG PO TABS
ORAL_TABLET | ORAL | 0 refills | Status: AC
Start: 2023-12-22 — End: 2023-12-27

## 2023-12-22 NOTE — Progress Notes (Signed)
 Date:  12/22/2023   Name:  Nicole Robles   DOB:  Mar 21, 1951   MRN:  [id number]   Chief Complaint: No chief complaint on file.  Sinusitis This is a new problem. The current episode started in the past 7 days (mid last week). The problem has been waxing and waning since onset. There has been no fever. The pain is mild. Associated symptoms include congestion. Pertinent negatives include no chills, coughing, diaphoresis, ear pain, headaches, hoarse voice, neck pain, shortness of breath, sinus pressure, sneezing, sore throat or swollen glands. The treatment provided moderate relief.    Lab Results  Component Value Date   NA 138 12/11/2023   K 4.5 12/11/2023   CO2 22 12/11/2023   GLUCOSE 103 (H) 12/11/2023   BUN 8 12/11/2023   CREATININE 0.77 12/11/2023   CALCIUM 9.0 12/11/2023   EGFR 81 12/11/2023   GFRNONAA >60 03/03/2020   Lab Results  Component Value Date   CHOL 176 12/11/2023   HDL 53 12/11/2023   LDLCALC 77 12/11/2023   TRIG 284 (H) 12/11/2023   Lab Results  Component Value Date   TSH 1.390 06/30/2023   Lab Results  Component Value Date   HGBA1C 6.1 (H) 12/11/2023   Lab Results  Component Value Date   WBC 9.0 02/19/2023   HGB 12.7 02/19/2023   HCT 40.5 02/19/2023   MCV 89 02/19/2023   PLT 212 02/19/2023   Lab Results  Component Value Date   ALT 16 12/11/2023   AST 22 12/11/2023   ALKPHOS 88 12/11/2023   BILITOT 0.2 12/11/2023   Lab Results  Component Value Date   VD25OH 54.2 06/16/2023     Review of Systems  Constitutional:  Negative for chills and diaphoresis.  HENT:  Positive for congestion. Negative for ear pain, hoarse voice, postnasal drip, rhinorrhea, sinus pressure, sneezing and sore throat.   Respiratory:  Negative for cough, chest tightness, shortness of breath and wheezing.   Cardiovascular:  Negative for chest pain, palpitations and leg swelling.  Gastrointestinal:  Negative for blood in stool and constipation.  Endocrine: Negative  for polydipsia and polyuria.  Musculoskeletal:  Negative for neck pain.  Neurological:  Negative for headaches.    Patient Active Problem List   Diagnosis Date Noted   Chronic right-sided low back pain with right-sided sciatica 08/20/2023   Special screening for malignant neoplasms, colon 07/04/2023   Hepatic steatosis 05/21/2023   Diverticulitis 05/21/2023   Vitamin D deficiency 03/05/2023   OSA (obstructive sleep apnea) 02/06/2023   Class 3 severe obesity with serious comorbidity and body mass index (BMI) of 40.0 to 44.9 in adult [facility]) 02/06/2023   Prediabetes 06/17/2022   Asthmatic bronchitis , chronic (HCC) 12/19/2020   Morbid obesity with BMI of 40.0-44.9, adult (HCC) 12/19/2020   Migraine without aura and without status migrainosus, not intractable 01/26/2019   Coronary artery disease involving native coronary artery of native heart 09/02/2018   Hyperlipidemia, mixed 08/04/2018   Bradycardia 08/04/2018   Hypothyroidism 07/28/2018   Recurrent major depressive disorder, in partial remission (HCC) 07/28/2018   Gastroesophageal reflux disease 07/28/2018   AVNRT (AV nodal re-entry tachycardia) (HCC) 07/30/2014   PSVT (paroxysmal supraventricular tachycardia) (HCC) 06/14/2014    Allergies  Allergen Reactions   Amoxicillin-Pot Clavulanate Hives and Rash   Oxycodone-Acetaminophen    Codeine Nausea And Vomiting   Meperidine Nausea And Vomiting   Meperidine Hcl Nausea And Vomiting   Morphine Nausea And Vomiting   Propoxyphene Nausea And Vomiting and  Rash    Past Surgical History:  Procedure Laterality Date   ABDOMINAL HYSTERECTOMY     CHOLECYSTECTOMY     COLONOSCOPY WITH PROPOFOL N/A 07/04/2023   Procedure: COLONOSCOPY WITH PROPOFOL;  Surgeon: Midge Minium, MD;  Location: [facility];  Service: Endoscopy;  Laterality: N/A;   heart ablation  07/30/2014    Social History   Tobacco Use   Smoking status: Former    Current packs/day: 0.00    Average packs/day: 1.5  packs/day for 20.0 years (30.0 ttl pk-yrs)    Types: Cigarettes    Start date: 10/07/1976    Quit date: 10/07/1996    Years since quitting: 27.2   Smokeless tobacco: Never  Vaping Use   Vaping status: Never Used  Substance Use Topics   Alcohol use: Never   Drug use: Never     Medication list has been reviewed and updated.  Current Meds  Medication Sig   albuterol (VENTOLIN HFA) 108 (90 Base) MCG/ACT inhaler INHALE 2 TO 4 PUFFS BY MOUTH EVERY 4 HOURS AS NEEDED FOR WHEEZING OR COUGH OR SHORTNESS OF BREATH   aspirin EC 81 MG tablet Take 81 mg by mouth daily.   levothyroxine (SYNTHROID) 88 MCG tablet Take 1 tablet (88 mcg total) by mouth daily before breakfast.   metFORMIN (GLUCOPHAGE-XR) 500 MG 24 hr tablet Take 2 tablets (1,000 mg total) by mouth 2 (two) times daily with a meal.   metoprolol succinate (TOPROL-XL) 100 MG 24 hr tablet Take 100 mg by mouth daily.   Multiple Vitamin (MULTIVITAMIN) capsule Take 1 capsule by mouth daily. Hair and skin   omeprazole (PRILOSEC) 20 MG capsule Take 20 mg by mouth 2 (two) times daily before a meal. GI   pravastatin (PRAVACHOL) 10 MG tablet Take 1 tablet (10 mg total) by mouth daily.   sertraline (ZOLOFT) 50 MG tablet Take 1 tablet (50 mg total) by mouth daily.   SUMAtriptan (IMITREX) 100 MG tablet TAKE 1 TABLET(100 MG) BY MOUTH 1 TIME FOR 1 DOSE. REPEAT IN 2 HOURS ONCE IF CONTINUE HEADACHE       12/11/2023    2:05 PM 06/30/2023   10:11 AM 05/12/2023    3:26 PM 12/23/2022   10:57 AM  GAD 7 : Generalized Anxiety Score  Nervous, Anxious, on Edge 0 0 0 0  Control/stop worrying 0 0 0 0  Worry too much - different things 0 0 0 0  Trouble relaxing 0 0 0 0  Restless 0 0 0 0  Easily annoyed or irritable 0 0 0 0  Afraid - awful might happen 0 0 0 0  Total GAD 7 Score 0 0 0 0  Anxiety Difficulty Not difficult at all Not difficult at all Not difficult at all Not difficult at all       12/11/2023    2:05 PM 06/30/2023   10:11 AM 05/12/2023    3:26 PM   Depression screen PHQ 2/9  Decreased Interest 0 0 0  Down, Depressed, Hopeless 0 0 0  PHQ - 2 Score 0 0 0  Altered sleeping 1 0 0  Tired, decreased energy 0 0 0  Change in appetite 0 0 0  Feeling bad or failure about yourself  0 0 0  Trouble concentrating 0 0 0  Moving slowly or fidgety/restless 0 0 0  Suicidal thoughts 0 0 0  PHQ-9 Score 1 0 0  Difficult doing work/chores Not difficult at all Not difficult at all Not difficult at all  BP Readings from Last 3 Encounters:  12/22/23 124/78  12/11/23 134/66  12/10/23 123/76    Physical Exam Vitals and nursing note reviewed.  Constitutional:      General: She is not in acute distress.    Appearance: She is not diaphoretic.  HENT:     Head: Normocephalic and atraumatic.     Right Ear: External ear normal. A middle ear effusion is present.     Left Ear: External ear normal. A middle ear effusion is present.     Nose:     Right Turbinates: Swollen.     Left Turbinates: Swollen.     Right Sinus: Maxillary sinus tenderness present. No frontal sinus tenderness.     Left Sinus: Maxillary sinus tenderness present. No frontal sinus tenderness.     Mouth/Throat:     Mouth: Mucous membranes are moist.  Eyes:     General:        Right eye: No discharge.        Left eye: No discharge.     Conjunctiva/sclera: Conjunctivae normal.     Pupils: Pupils are equal, round, and reactive to light.  Neck:     Thyroid: No thyromegaly.     Vascular: No JVD.  Cardiovascular:     Rate and Rhythm: Normal rate and regular rhythm.     Heart sounds: Normal heart sounds, S1 normal and S2 normal. No murmur heard.    No systolic murmur is present.     No diastolic murmur is present.     No friction rub. No gallop. No S3 or S4 sounds.  Pulmonary:     Effort: Pulmonary effort is normal.     Breath sounds: Normal breath sounds. No wheezing, rhonchi or rales.  Abdominal:     General: Bowel sounds are normal.     Palpations: Abdomen is soft. There  is no mass.     Tenderness: There is no abdominal tenderness. There is no guarding.  Musculoskeletal:        General: Normal range of motion.     Cervical back: Normal range of motion and neck supple.  Lymphadenopathy:     Cervical: No cervical adenopathy.  Skin:    General: Skin is warm and dry.  Neurological:     Mental Status: She is alert.     Deep Tendon Reflexes: Reflexes are normal and symmetric.     Wt Readings from Last 3 Encounters:  12/22/23 190 lb (86.2 kg)  12/11/23 192 lb (87.1 kg)  12/10/23 188 lb (85.3 kg)    BP 124/78   Pulse 85   Ht 5' (1.524 m)   Wt 190 lb (86.2 kg)   SpO2 94%   BMI 37.11 kg/m   Assessment and Plan: 1. Acute maxillary sinusitis, recurrence not specified (Primary) Acute.  Persistent.  Relatively stable.  Began last week with sinus discomfort and a lymph node that she felt on the left tonsillar area.  Examination is unremarkable for significant myopathy.  There is erythema of the throat and tenderness over maxillary sinuses bilateral.  Examination and history is consistent with maxillary - azithromycin (ZITHROMAX) 250 MG tablet; Take 2 tablets on day 1, then 1 tablet daily on days 2 through 5  Dispense: 6 tablet; Refill: 0     Elizabeth Sauer, MD

## 2023-12-24 ENCOUNTER — Telehealth (INDEPENDENT_AMBULATORY_CARE_PROVIDER_SITE_OTHER): Payer: Self-pay | Admitting: Internal Medicine

## 2023-12-24 NOTE — Telephone Encounter (Signed)
 The pt asked that someone call her at [phone]. She states that she needs to explain something to the CMA and get their advice. Pt also expressed that her spouse recently passed and she had some concerns. Please follow up with the pt.

## 2023-12-25 ENCOUNTER — Telehealth (INDEPENDENT_AMBULATORY_CARE_PROVIDER_SITE_OTHER): Payer: Self-pay | Admitting: Internal Medicine

## 2023-12-25 NOTE — Telephone Encounter (Signed)
 03/20 pt wants a call back on instructions for [employer], wants to know if she should wein herself off it

## 2023-12-25 NOTE — Telephone Encounter (Signed)
Returned call, left message for pt to callback.

## 2024-01-13 ENCOUNTER — Ambulatory Visit (INDEPENDENT_AMBULATORY_CARE_PROVIDER_SITE_OTHER): Admitting: Internal Medicine

## 2024-01-29 ENCOUNTER — Other Ambulatory Visit: Payer: Self-pay | Admitting: Family Medicine

## 2024-01-29 DIAGNOSIS — Z8669 Personal history of other diseases of the nervous system and sense organs: Secondary | ICD-10-CM

## 2024-01-30 NOTE — Telephone Encounter (Signed)
 Requested Prescriptions  Pending Prescriptions Disp Refills   SUMAtriptan  (IMITREX ) 100 MG tablet [Pharmacy Med Name: SUMATRIPTAN  100MG  TABLETS] 9 tablet 0    Sig: TAKE 1 TABLET(100 MG) BY MOUTH 1 TIME FOR 1 DOSE. REPEAT IN 2 HOURS ONCE IF CONTINUE HEADACHE     Neurology:  Migraine Therapy - Triptan Passed - 01/30/2024 10:14 AM      Passed - Last BP in normal range    BP Readings from Last 1 Encounters:  12/22/23 124/78         Passed - Valid encounter within last 12 months    Recent Outpatient Visits           1 month ago Acute maxillary sinusitis, recurrence not specified   [facility] Part, MD   1 month ago Hyperlipidemia, mixed   [facility] Kayla Part, MD       Future Appointments             In 3 months Barnetta Liberty, MD [facility] at [facility], [facility]

## 2024-03-18 ENCOUNTER — Other Ambulatory Visit (INDEPENDENT_AMBULATORY_CARE_PROVIDER_SITE_OTHER): Payer: Self-pay | Admitting: Internal Medicine

## 2024-03-18 DIAGNOSIS — R7303 Prediabetes: Secondary | ICD-10-CM

## 2024-05-28 ENCOUNTER — Encounter: Admitting: Student
